# Patient Record
Sex: Female | Born: 1940 | Race: Black or African American | Hispanic: No | Marital: Single | Smoking: Never smoker
Health system: Southern US, Community
[De-identification: ages and names within clinical notes are randomized; demographics above are authoritative.]

## PROBLEM LIST (undated history)

## (undated) DIAGNOSIS — E039 Hypothyroidism, unspecified: Secondary | ICD-10-CM

## (undated) DIAGNOSIS — E871 Hypo-osmolality and hyponatremia: Secondary | ICD-10-CM

## (undated) DIAGNOSIS — R55 Syncope and collapse: Secondary | ICD-10-CM

## (undated) DIAGNOSIS — K219 Gastro-esophageal reflux disease without esophagitis: Secondary | ICD-10-CM

## (undated) DIAGNOSIS — E559 Vitamin D deficiency, unspecified: Secondary | ICD-10-CM

## (undated) DIAGNOSIS — I482 Chronic atrial fibrillation, unspecified: Secondary | ICD-10-CM

## (undated) DIAGNOSIS — I10 Essential (primary) hypertension: Secondary | ICD-10-CM

## (undated) DIAGNOSIS — S2242XA Multiple fractures of ribs, left side, initial encounter for closed fracture: Secondary | ICD-10-CM

## (undated) DIAGNOSIS — D638 Anemia in other chronic diseases classified elsewhere: Secondary | ICD-10-CM

## (undated) DIAGNOSIS — D696 Thrombocytopenia, unspecified: Secondary | ICD-10-CM

## (undated) HISTORY — PX: CHOLECYSTECTOMY: SHX55

## (undated) HISTORY — DX: Multiple fractures of ribs, left side, initial encounter for closed fracture: S22.42XA

## (undated) HISTORY — DX: Vitamin D deficiency, unspecified: E55.9

## (undated) HISTORY — DX: Anemia in other chronic diseases classified elsewhere: D63.8

## (undated) HISTORY — DX: Chronic atrial fibrillation, unspecified: I48.20

## (undated) HISTORY — DX: Hypomagnesemia: E83.42

## (undated) HISTORY — DX: Hypo-osmolality and hyponatremia: E87.1

## (undated) HISTORY — DX: Syncope and collapse: R55

## (undated) HISTORY — DX: Hypothyroidism, unspecified: E03.9

## (undated) HISTORY — DX: Thrombocytopenia, unspecified: D69.6

---

## 2009-05-26 ENCOUNTER — Inpatient Hospital Stay (HOSPITAL_COMMUNITY): Admission: EM | Admit: 2009-05-26 | Discharge: 2009-05-28 | Payer: Self-pay | Admitting: Emergency Medicine

## 2009-05-26 ENCOUNTER — Ambulatory Visit: Payer: Self-pay | Admitting: Internal Medicine

## 2010-03-25 LAB — SODIUM, URINE, RANDOM: Sodium, Ur: 59 mEq/L

## 2010-03-25 LAB — BASIC METABOLIC PANEL
BUN: 2 mg/dL — ABNORMAL LOW (ref 6–23)
BUN: 3 mg/dL — ABNORMAL LOW (ref 6–23)
CO2: 21 mEq/L (ref 19–32)
CO2: 22 mEq/L (ref 19–32)
Calcium: 7.6 mg/dL — ABNORMAL LOW (ref 8.4–10.5)
Calcium: 8.8 mg/dL (ref 8.4–10.5)
Chloride: 103 mEq/L (ref 96–112)
Creatinine, Ser: 0.71 mg/dL (ref 0.4–1.2)
Creatinine, Ser: 0.77 mg/dL (ref 0.4–1.2)
GFR calc Af Amer: >60 mL/min (ref >60)
GFR calc non Af Amer: >60 mL/min (ref >60)
GFR calc non Af Amer: >60 mL/min (ref >60)
Glucose, Bld: 112 mg/dL — ABNORMAL HIGH (ref 70–99)
Glucose, Bld: 98 mg/dL (ref 70–99)
Potassium: 4.4 mEq/L (ref 3.5–5.1)
Sodium: 131 mEq/L — ABNORMAL LOW (ref 135–145)

## 2010-03-25 LAB — DIFFERENTIAL
Basophils Absolute: 0 10*3/uL (ref 0.0–0.1)
Eosinophils Relative: 1 % (ref 0–5)
Monocytes Absolute: 0.5 10*3/uL (ref 0.1–1.0)
Neutrophils Relative %: 66 % (ref 43–77)

## 2010-03-25 LAB — PHOSPHORUS
Phosphorus: 2.2 mg/dL — ABNORMAL LOW (ref 2.3–4.6)
Phosphorus: 2.9 mg/dL (ref 2.3–4.6)

## 2010-03-25 LAB — MAGNESIUM
Magnesium: 1 mg/dL — ABNORMAL LOW (ref 1.5–2.5)
Magnesium: 1.6 mg/dL (ref 1.5–2.5)
Magnesium: 1.6 mg/dL (ref 1.5–2.5)

## 2010-03-25 LAB — COMPREHENSIVE METABOLIC PANEL
AST: 75 U/L — ABNORMAL HIGH (ref 0–37)
Albumin: 3.7 g/dL (ref 3.5–5.2)
Alkaline Phosphatase: 117 U/L (ref 39–117)
Calcium: 8.1 mg/dL — ABNORMAL LOW (ref 8.4–10.5)
Creatinine, Ser: 0.9 mg/dL (ref 0.4–1.2)
GFR calc non Af Amer: >60 mL/min (ref >60)
Sodium: 124 mEq/L — ABNORMAL LOW (ref 135–145)
Total Protein: 7 g/dL (ref 6.0–8.3)

## 2010-03-25 LAB — CARDIAC PANEL(CRET KIN+CKTOT+MB+TROPI)
CK, MB: 2.4 ng/mL (ref 0.3–4.0)
Relative Index: 2.2 (ref 0.0–2.5)
Relative Index: 2.3 (ref 0.0–2.5)
Total CK: 101 U/L (ref 7–177)
Total CK: 108 U/L (ref 7–177)
Troponin I: 0.03 ng/mL (ref 0.00–0.06)
Troponin I: 0.03 ng/mL (ref 0.00–0.06)

## 2010-03-25 LAB — CBC
HCT: 27.2 % — ABNORMAL LOW (ref 36.0–46.0)
HCT: 29 % — ABNORMAL LOW (ref 36.0–46.0)
Hemoglobin: 9.4 g/dL — ABNORMAL LOW (ref 12.0–15.0)
MCHC: 34.7 g/dL (ref 30.0–36.0)
MCHC: 34.7 g/dL (ref 30.0–36.0)
MCV: 89 fL (ref 78.0–100.0)
MCV: 90.4 fL (ref 78.0–100.0)
Platelets: 134 10*3/uL — ABNORMAL LOW (ref 150–400)
Platelets: 135 10*3/uL — ABNORMAL LOW (ref 150–400)
RBC: 3.01 MIL/uL — ABNORMAL LOW (ref 3.87–5.11)
RDW: 12.8 % (ref 11.5–15.5)
RDW: 12.8 % (ref 11.5–15.5)
WBC: 6.1 10*3/uL (ref 4.0–10.5)

## 2010-03-25 LAB — CK TOTAL AND CKMB (NOT AT ARMC)
CK, MB: 1.9 ng/mL (ref 0.3–4.0)
Relative Index: INVALID (ref 0.0–2.5)
Total CK: 99 U/L (ref 7–177)

## 2010-03-25 LAB — LIPID PANEL
Total CHOL/HDL Ratio: 1.4 RATIO
Triglycerides: 33 mg/dL (ref <150)
VLDL: 7 mg/dL (ref 0–40)

## 2010-03-25 LAB — RAPID URINE DRUG SCREEN, HOSP PERFORMED
Amphetamines: NOT DETECTED
Barbiturates: NOT DETECTED
Cocaine: NOT DETECTED

## 2010-03-25 LAB — IRON AND TIBC
Iron: 223 ug/dL — ABNORMAL HIGH (ref 42–135)
Saturation Ratios: 76 % — ABNORMAL HIGH (ref 20–55)
TIBC: 294 ug/dL (ref 250–470)
UIBC: 71 ug/dL

## 2010-03-25 LAB — TROPONIN I: Troponin I: 0.01 ng/mL (ref 0.00–0.06)

## 2010-03-25 LAB — TSH: TSH: 1.456 u[IU]/mL (ref 0.350–4.500)

## 2010-03-25 LAB — RETICULOCYTES
RBC.: 3.36 MIL/uL — ABNORMAL LOW (ref 3.87–5.11)
Retic Count, Absolute: 33.6 10*3/uL (ref 19.0–186.0)

## 2010-03-25 LAB — OSMOLALITY: Osmolality: 256 mOsm/kg — ABNORMAL LOW (ref 275–300)

## 2010-03-25 LAB — VITAMIN B12: Vitamin B-12: 724 pg/mL (ref 211–911)

## 2010-03-25 LAB — FOLATE: Folate: >20 ng/mL

## 2010-03-25 LAB — T4, FREE: Free T4: 0.98 ng/dL (ref 0.80–1.80)

## 2010-03-25 LAB — OSMOLALITY, URINE: Osmolality, Ur: 301 mOsm/kg — ABNORMAL LOW (ref 390–1090)

## 2010-03-25 LAB — FERRITIN: Ferritin: 363 ng/mL — ABNORMAL HIGH (ref 10–291)

## 2010-05-24 LAB — FECAL OCCULT BLOOD, GUAIAC: FECAL OCCULT BLD: NEGATIVE

## 2010-12-11 ENCOUNTER — Emergency Department (HOSPITAL_BASED_OUTPATIENT_CLINIC_OR_DEPARTMENT_OTHER)
Admission: EM | Admit: 2010-12-11 | Discharge: 2010-12-11 | Disposition: A | Discharge status: Home / Self Care | Payer: Medicare HMO | Attending: Emergency Medicine | Admitting: Emergency Medicine

## 2010-12-11 ENCOUNTER — Encounter: Payer: Self-pay | Admitting: Family Medicine

## 2010-12-11 ENCOUNTER — Emergency Department (INDEPENDENT_AMBULATORY_CARE_PROVIDER_SITE_OTHER): Payer: Medicare HMO

## 2010-12-11 DIAGNOSIS — K219 Gastro-esophageal reflux disease without esophagitis: Secondary | ICD-10-CM | POA: Insufficient documentation

## 2010-12-11 DIAGNOSIS — S2239XA Fracture of one rib, unspecified side, initial encounter for closed fracture: Secondary | ICD-10-CM

## 2010-12-11 DIAGNOSIS — R071 Chest pain on breathing: Secondary | ICD-10-CM | POA: Insufficient documentation

## 2010-12-11 DIAGNOSIS — W1809XA Striking against other object with subsequent fall, initial encounter: Secondary | ICD-10-CM

## 2010-12-11 DIAGNOSIS — S2249XA Multiple fractures of ribs, unspecified side, initial encounter for closed fracture: Secondary | ICD-10-CM

## 2010-12-11 DIAGNOSIS — W19XXXA Unspecified fall, initial encounter: Secondary | ICD-10-CM | POA: Insufficient documentation

## 2010-12-11 DIAGNOSIS — R079 Chest pain, unspecified: Secondary | ICD-10-CM

## 2010-12-11 DIAGNOSIS — Y92009 Unspecified place in unspecified non-institutional (private) residence as the place of occurrence of the external cause: Secondary | ICD-10-CM | POA: Insufficient documentation

## 2010-12-11 DIAGNOSIS — I10 Essential (primary) hypertension: Secondary | ICD-10-CM | POA: Insufficient documentation

## 2010-12-11 HISTORY — DX: Gastro-esophageal reflux disease without esophagitis: K21.9

## 2010-12-11 HISTORY — DX: Essential (primary) hypertension: I10

## 2010-12-11 LAB — BASIC METABOLIC PANEL
BUN: 14 mg/dL (ref 6–23)
Calcium: 9.4 mg/dL (ref 8.4–10.5)
Chloride: 98 mEq/L (ref 96–112)
Creatinine, Ser: 0.8 mg/dL (ref 0.50–1.10)
GFR calc Af Amer: 86 mL/min — ABNORMAL LOW (ref >90)
GFR calc non Af Amer: 74 mL/min — ABNORMAL LOW (ref >90)

## 2010-12-11 MED ORDER — OXYCODONE-ACETAMINOPHEN 5-325 MG PO TABS: 1.0000 | ORAL_TABLET | ORAL | Status: AC | PRN

## 2010-12-11 NOTE — ED Notes (Signed)
Dr. Tucich at bedside. 

## 2010-12-11 NOTE — ED Provider Notes (Addendum)
History     CSN: [id number] Arrival date & time: 12/11/2010  5:03 PM   First MD Initiated Contact with Patient 12/11/10 1747      Chief Complaint  Patient presents with  . Fall   patient fell yesterday. Currently to different times. Complains of injury to ribs. Pain is worse with deep breathing, moving or palpation.  Patient fell yesterday while she was holding the baby in. She states she "lost her balance." She denied any chest pain or palpitations. She had no injury to her head neck, pelvis, hips or extremities. She has had no recent illness or injury.  (Consider location/radiation/quality/duration/timing/severity/associated sxs/prior treatment) HPI  Past Medical History  Diagnosis Date  . Hypertension   . GERD (gastroesophageal reflux disease)     Past Surgical History  Procedure Date  . Cholecystectomy     No family history on file.  History  Substance Use Topics  . Smoking status: Never Smoker   . Smokeless tobacco: Not on file  . Alcohol Use: No    OB History    Grav Para Term Preterm Abortions TAB SAB Ect Mult Living                  Review of Systems  Allergies  Review of patient's allergies indicates not on file.  Home Medications  No current outpatient prescriptions on file.  BP 137/91  Pulse 84  Temp(Src) 98.2 F (36.8 C) (Oral)  Resp 16  Ht 5' 5.5" (1.664 m)  Wt 141 lb (63.957 kg)  BMI 23.11 kg/m2  SpO2 100%  Physical Exam  ED Course  Procedures (including critical care time)  Labs Reviewed - No data to display No results found.   No diagnosis found.    MDM  Pt is seen and examined;  Initial history and physical completed.  Will follow.     No results found.       Dg Ribs Unilateral W/chest Right  12/11/2010  *RADIOLOGY REPORT*  Clinical Data: Right chest pain status post fall.  RIGHT RIBS AND CHEST - 3+ VIEW  Comparison: None.  Findings: The heart is mildly enlarged.  The mediastinal contours are otherwise normal.  The  lungs are clear.  There is no pleural effusion or pneumothorax.  At least two nondisplaced right-sided rib fractures are noted, best seen on the oblique view and involving the fifth and sixth ribs. No displaced fracture is identified.  IMPRESSION:  1.  Acute nondisplaced fractures of the right fifth and sixth ribs. 2.  No pleural effusion, pneumothorax or other acute cardiopulmonary process.  Original Report Authenticated By: Gerrianne Scale, M.D.    6:56 PM Patient was given incentive spirometer with instructions to use. Electrolyte panel reviewed with the patient. The family and patient were given. Specific instructions for close outpatient followup with the primary care physician in 3 days to be rechecked. Again, the patient reported that she did not have any dizzy spells was just off balance when she fell. She will be stable for discharge at this time. She is given a short course of Percocet for pain control and was encouraged to take deep breaths throughout the day to avoid splinting and avoid pneumonia. They're instructed to return to the ED for any concerns such as difficulty breathing, fever, or chest pain, etc.    Theron Arista A. Patrica Duel, MD 12/11/10 1911  Lorelle Gibbs. Patrica Duel, MD 12/11/10 1911

## 2010-12-11 NOTE — ED Notes (Signed)
Pt sts she fell yesterday 2 x and "injured right rib pain that hurts to breath deep and moving". Pt sts she lives alone and ambulates without assistance.

## 2010-12-11 NOTE — ED Notes (Signed)
Pt attempted to provide urine sample but could not go.

## 2010-12-11 NOTE — ED Notes (Signed)
Pt acuity changed to level 3 due to pain level.

## 2012-06-12 ENCOUNTER — Emergency Department (HOSPITAL_BASED_OUTPATIENT_CLINIC_OR_DEPARTMENT_OTHER): Payer: Medicare Other

## 2012-06-12 ENCOUNTER — Emergency Department (HOSPITAL_BASED_OUTPATIENT_CLINIC_OR_DEPARTMENT_OTHER)
Admission: EM | Admit: 2012-06-12 | Discharge: 2012-06-12 | Disposition: A | Discharge status: Other Acute Inpatient Hospital | Payer: Medicare Other | Attending: Emergency Medicine | Admitting: Emergency Medicine

## 2012-06-12 ENCOUNTER — Encounter (HOSPITAL_BASED_OUTPATIENT_CLINIC_OR_DEPARTMENT_OTHER): Payer: Self-pay | Admitting: Emergency Medicine

## 2012-06-12 DIAGNOSIS — R0989 Other specified symptoms and signs involving the circulatory and respiratory systems: Secondary | ICD-10-CM | POA: Insufficient documentation

## 2012-06-12 DIAGNOSIS — R110 Nausea: Secondary | ICD-10-CM | POA: Insufficient documentation

## 2012-06-12 DIAGNOSIS — R059 Cough, unspecified: Secondary | ICD-10-CM | POA: Insufficient documentation

## 2012-06-12 DIAGNOSIS — K219 Gastro-esophageal reflux disease without esophagitis: Secondary | ICD-10-CM | POA: Insufficient documentation

## 2012-06-12 DIAGNOSIS — Z79899 Other long term (current) drug therapy: Secondary | ICD-10-CM | POA: Insufficient documentation

## 2012-06-12 DIAGNOSIS — I10 Essential (primary) hypertension: Secondary | ICD-10-CM | POA: Insufficient documentation

## 2012-06-12 DIAGNOSIS — I509 Heart failure, unspecified: Secondary | ICD-10-CM | POA: Insufficient documentation

## 2012-06-12 DIAGNOSIS — R05 Cough: Secondary | ICD-10-CM | POA: Insufficient documentation

## 2012-06-12 DIAGNOSIS — R0609 Other forms of dyspnea: Secondary | ICD-10-CM | POA: Insufficient documentation

## 2012-06-12 LAB — CBC WITH DIFFERENTIAL/PLATELET
Basophils Absolute: 0 10*3/uL (ref 0.0–0.1)
Basophils Relative: 0 % (ref 0–1)
HCT: 26.8 % — ABNORMAL LOW (ref 36.0–46.0)
MCHC: 36.9 g/dL — ABNORMAL HIGH (ref 30.0–36.0)
Monocytes Absolute: 1.3 10*3/uL — ABNORMAL HIGH (ref 0.1–1.0)
Neutro Abs: 4.7 10*3/uL (ref 1.7–7.7)
Platelets: 302 10*3/uL (ref 150–400)
RDW: 13.5 % (ref 11.5–15.5)

## 2012-06-12 LAB — BASIC METABOLIC PANEL
Calcium: 9.1 mg/dL (ref 8.4–10.5)
Chloride: 97 mEq/L (ref 96–112)
Creatinine, Ser: 0.6 mg/dL (ref 0.50–1.10)
GFR calc Af Amer: >90 mL/min (ref >90)

## 2012-06-12 LAB — PRO B NATRIURETIC PEPTIDE: Pro B Natriuretic peptide (BNP): 2232 pg/mL — ABNORMAL HIGH (ref 0–125)

## 2012-06-12 NOTE — ED Notes (Signed)
Sob and cough x 1 week.  Occasional yellow sputum.  Pt sob on exertion.

## 2012-06-12 NOTE — ED Provider Notes (Signed)
History     This chart was scribed for Monica Munch, MD by Jiles Prows, ED Scribe. The patient was seen in room MH11/MH11 and the patient's care was started at 5:54 PM.    CSN: [id number]  Arrival date & time 06/12/12  1725   Chief Complaint  Patient presents with  . Cough  . Shortness of Breath    The history is provided by the patient and medical records. No language interpreter was used.   HPI Comments: Monica Ingram is a 72 y.o. female with a h/o GERD and HTN who presents to the Emergency Department complaining of moderate cough, shortness of breath, and nausea that began about a week ago. The cough is occasionally productive of yellow sputum.  She reports the SOB is exacerbated upon exertion.  Pt has tried hot tea and honey but found no relief.  Pt denies LOC, headache, diaphoresis, fever, chills, vomiting, diarrhea, weakness, and any other pain.   Past Medical History  Diagnosis Date  . Hypertension   . GERD (gastroesophageal reflux disease)     Past Surgical History  Procedure Laterality Date  . Cholecystectomy      No family history on file.  History  Substance Use Topics  . Smoking status: Never Smoker   . Smokeless tobacco: Not on file  . Alcohol Use: No    OB History   Grav Para Term Preterm Abortions TAB SAB Ect Mult Living                  Review of Systems  Constitutional:       Per HPI, otherwise negative  HENT:       Per HPI, otherwise negative  Respiratory: Positive for cough and shortness of breath.        Per HPI, otherwise negative  Cardiovascular:       Per HPI, otherwise negative  Gastrointestinal: Positive for nausea. Negative for vomiting.  Endocrine:       Negative aside from HPI  Genitourinary:       Neg aside from HPI   Musculoskeletal:       Per HPI, otherwise negative  Skin: Negative.   Neurological: Negative for syncope.  A complete 10 system review of systems was obtained and all systems are negative except as noted in the  HPI and PMH.    Allergies  Other  Home Medications   Current Outpatient Rx  Name  Route  Sig  Dispense  Refill  . amLODipine (NORVASC) 5 MG tablet   Oral   Take 5 mg by mouth daily.           . calcium carbonate (CALCIUM 500) 1250 MG tablet   Oral   Take 1 tablet by mouth daily.           . Cholecalciferol (VITAMIN D) 2000 UNITS tablet   Oral   Take 4,000-6,000 Units by mouth 2 (two) times daily. Take 3 tabs with the biggest meal and take 2 tabs in the afternoon          . esomeprazole (NEXIUM) 40 MG capsule   Oral   Take 40 mg by mouth daily before breakfast.           . fish oil-omega-3 fatty acids 1000 MG capsule   Oral   Take 1 g by mouth daily.           . metoprolol tartrate (LOPRESSOR) 25 MG tablet   Oral   Take 25 mg by  mouth 2 (two) times daily.           . potassium chloride SA (K-DUR,KLOR-CON) 20 MEQ tablet   Oral   Take 20 mEq by mouth daily.             Triage Vitals: BP 154/96  Pulse 103  Temp(Src) 98.5 F (36.9 C) (Oral)  Resp 20  Ht 5\' 7"  (1.702 m)  Wt 136 lb (61.689 kg)  BMI 21.3 kg/m2  SpO2 100%  Physical Exam  Nursing note and vitals reviewed. Constitutional: She is oriented to person, place, and time. She appears well-developed and well-nourished. No distress.  HENT:  Head: Normocephalic and atraumatic.  Eyes: Conjunctivae and EOM are normal.  Cardiovascular: Normal rate, regular rhythm and normal heart sounds.   Pulmonary/Chest: Effort normal and breath sounds normal. No stridor. No respiratory distress.  Abdominal: She exhibits no distension.  Musculoskeletal: She exhibits no edema.  Neurological: She is alert and oriented to person, place, and time. No cranial nerve deficit.  Skin: Skin is warm and dry.  Psychiatric: She has a normal mood and affect.    ED Course  Procedures (including critical care time) 5:56 PM - Discussed ED treatment with pt at bedside including labs and chest x-ray and pt agrees.   Labs  Reviewed - No data to display No results found.   No diagnosis found.   Date: 06/12/2012  Rate: 90  Rhythm: atrial fibrillation  QRS Axis: left  Intervals: afib  ST/T Wave abnormalities: nonspecific T wave changes  Conduction Disutrbances:none  Narrative Interpretation:   Old EKG Reviewed: none available AFIB  Card: 85afib, O2-93%ra, borderline.  Update: On repeat exam the patient appears calm, but continues to be in atrial fibrillation.  She now states that she is currently being evaluated for some hepatic lesion.  During that evaluation she has been advised to see a cardiologist for evaluation, though she lacks any cardiology diagnosis thus far.    MDM   I personally performed the services described in this documentation, which was scribed in my presence. The recorded information has been reviewed and is accurate. On exam the patient is borderline hypoxic, though in no distress.  However, she has evidence of new atrial fibrillation, new elevated BNP concerning for heart failure.  She has no prior echocardiogram, and given her history of hypertension, her lack of prior evaluation, she was admitted for further evaluation and management. This patient presents with ongoing dyspnea.    Monica Munch, MD 06/12/12 2121

## 2014-04-20 ENCOUNTER — Emergency Department (HOSPITAL_BASED_OUTPATIENT_CLINIC_OR_DEPARTMENT_OTHER): Payer: Medicare HMO

## 2014-04-20 ENCOUNTER — Emergency Department (HOSPITAL_BASED_OUTPATIENT_CLINIC_OR_DEPARTMENT_OTHER)
Admission: EM | Admit: 2014-04-20 | Discharge: 2014-04-20 | Disposition: A | Discharge status: Home / Self Care | Payer: Medicare HMO | Attending: Emergency Medicine | Admitting: Emergency Medicine

## 2014-04-20 ENCOUNTER — Encounter (HOSPITAL_BASED_OUTPATIENT_CLINIC_OR_DEPARTMENT_OTHER): Payer: Self-pay

## 2014-04-20 DIAGNOSIS — K219 Gastro-esophageal reflux disease without esophagitis: Secondary | ICD-10-CM | POA: Diagnosis not present

## 2014-04-20 DIAGNOSIS — R059 Cough, unspecified: Secondary | ICD-10-CM

## 2014-04-20 DIAGNOSIS — I10 Essential (primary) hypertension: Secondary | ICD-10-CM | POA: Diagnosis not present

## 2014-04-20 DIAGNOSIS — R05 Cough: Secondary | ICD-10-CM | POA: Diagnosis present

## 2014-04-20 DIAGNOSIS — Z79899 Other long term (current) drug therapy: Secondary | ICD-10-CM | POA: Insufficient documentation

## 2014-04-20 DIAGNOSIS — J302 Other seasonal allergic rhinitis: Secondary | ICD-10-CM | POA: Diagnosis not present

## 2014-04-20 MED ORDER — BENZONATATE 100 MG PO CAPS: 100.0000 mg | ORAL_CAPSULE | Freq: Three times a day (TID) | ORAL | Status: DC | PRN

## 2014-04-20 NOTE — ED Notes (Signed)
Reports cough x 2 weeks, sometimes productive

## 2014-04-20 NOTE — Discharge Instructions (Signed)
Allergies °Allergies may happen from anything your body is sensitive to. This may be food, medicines, pollens, chemicals, and nearly anything around you in everyday life that produces allergens. An allergen is anything that causes an allergy producing substance. Heredity is often a factor in causing these problems. This means you may have some of the same allergies as your parents. °Food allergies happen in all age groups. Food allergies are some of the most severe and life threatening. Some common food allergies are cow's milk, seafood, eggs, nuts, wheat, and soybeans. °SYMPTOMS  °· Swelling around the mouth. °· An itchy red rash or hives. °· Vomiting or diarrhea. °· Difficulty breathing. °SEVERE ALLERGIC REACTIONS ARE LIFE-THREATENING. °This reaction is called anaphylaxis. It can cause the mouth and throat to swell and cause difficulty with breathing and swallowing. In severe reactions only a trace amount of food (for example, peanut oil in a salad) may cause death within seconds. °Seasonal allergies occur in all age groups. These are seasonal because they usually occur during the same season every year. They may be a reaction to molds, grass pollens, or tree pollens. Other causes of problems are house dust mite allergens, pet dander, and mold spores. The symptoms often consist of nasal congestion, a runny itchy nose associated with sneezing, and tearing itchy eyes. There is often an associated itching of the mouth and ears. The problems happen when you come in contact with pollens and other allergens. Allergens are the particles in the air that the body reacts to with an allergic reaction. This causes you to release allergic antibodies. Through a chain of events, these eventually cause you to release histamine into the blood stream. Although it is meant to be protective to the body, it is this release that causes your discomfort. This is why you were given anti-histamines to feel better.  If you are unable to  pinpoint the offending allergen, it may be determined by skin or blood testing. Allergies cannot be cured but can be controlled with medicine. °Hay fever is a collection of all or some of the seasonal allergy problems. It may often be treated with simple over-the-counter medicine such as diphenhydramine. Take medicine as directed. Do not drink alcohol or drive while taking this medicine. Check with your caregiver or package insert for child dosages. °If these medicines are not effective, there are many new medicines your caregiver can prescribe. Stronger medicine such as nasal spray, eye drops, and corticosteroids may be used if the first things you try do not work well. Other treatments such as immunotherapy or desensitizing injections can be used if all else fails. Follow up with your caregiver if problems continue. These seasonal allergies are usually not life threatening. They are generally more of a nuisance that can often be handled using medicine. °HOME CARE INSTRUCTIONS  °· If unsure what causes a reaction, keep a diary of foods eaten and symptoms that follow. Avoid foods that cause reactions. °· If hives or rash are present: °· Take medicine as directed. °· You may use an over-the-counter antihistamine (diphenhydramine) for hives and itching as needed. °· Apply cold compresses (cloths) to the skin or take baths in cool water. Avoid hot baths or showers. Heat will make a rash and itching worse. °· If you are severely allergic: °· Following a treatment for a severe reaction, hospitalization is often required for closer follow-up. °· Wear a medic-alert bracelet or necklace stating the allergy. °· You and your family must learn how to give adrenaline or use   an anaphylaxis kit.  If you have had a severe reaction, always carry your anaphylaxis kit or EpiPen with you. Use this medicine as directed by your caregiver if a severe reaction is occurring. Failure to do so could have a fatal outcome. SEEK MEDICAL  CARE IF:  You suspect a food allergy. Symptoms generally happen within 30 minutes of eating a food.  Your symptoms have not gone away within 2 days or are getting worse.  You develop new symptoms.  You want to retest yourself or your child with a food or drink you think causes an allergic reaction. Never do this if an anaphylactic reaction to that food or drink has happened before. Only do this under the care of a caregiver. SEEK IMMEDIATE MEDICAL CARE IF:   You have difficulty breathing, are wheezing, or have a tight feeling in your chest or throat.  You have a swollen mouth, or you have hives, swelling, or itching all over your body.  You have had a severe reaction that has responded to your anaphylaxis kit or an EpiPen. These reactions may return when the medicine has worn off. These reactions should be considered life threatening. MAKE SURE YOU:   Understand these instructions.  Will watch your condition.  Will get help right away if you are not doing well or get worse. Document Released: 03/18/2002 Document Revised: 04/19/2012 Document Reviewed: 08/23/2007 [facility] Patient Information 2015 [place], [place]. This information is not intended to replace advice given to you by your health care provider. Make sure you discuss any questions you have with your health care provider.     Cough, Adult  A cough is a reflex that helps clear your throat and airways. It can help heal the body or may be a reaction to an irritated airway. A cough may only last 2 or 3 weeks (acute) or may last more than 8 weeks (chronic).  CAUSES Acute cough:  Viral or bacterial infections. Chronic cough:  Infections.  Allergies.  Asthma.  Post-nasal drip.  Smoking.  Heartburn or acid reflux.  Some medicines.  Chronic lung problems (COPD).  Cancer. SYMPTOMS   Cough.  Fever.  Chest pain.  Increased breathing rate.  High-pitched whistling sound when breathing (wheezing).  Colored  mucus that you cough up (sputum). TREATMENT   A bacterial cough may be treated with antibiotic medicine.  A viral cough must run its course and will not respond to antibiotics.  Your caregiver may recommend other treatments if you have a chronic cough. HOME CARE INSTRUCTIONS   Only take over-the-counter or prescription medicines for pain, discomfort, or fever as directed by your caregiver. Use cough suppressants only as directed by your caregiver.  Use a cold steam vaporizer or humidifier in your bedroom or home to help loosen secretions.  Sleep in a semi-upright position if your cough is worse at night.  Rest as needed.  Stop smoking if you smoke. SEEK IMMEDIATE MEDICAL CARE IF:   You have pus in your sputum.  Your cough starts to worsen.  You cannot control your cough with suppressants and are losing sleep.  You begin coughing up blood.  You have difficulty breathing.  You develop pain which is getting worse or is uncontrolled with medicine.  You have a fever. MAKE SURE YOU:   Understand these instructions.  Will watch your condition.  Will get help right away if you are not doing well or get worse. Document Released: 06/21/2010 Document Revised: 03/17/2011 Document Reviewed: 06/21/2010 [employer] Patient Information  2015 [employer]. This information is not intended to replace advice given to you by your health care provider. Make sure you discuss any questions you have with your health care provider.

## 2014-04-20 NOTE — ED Provider Notes (Signed)
CSN: [id number]     Arrival date & time 04/20/14  1513 History   First MD Initiated Contact with Patient 04/20/14 1546     Chief Complaint  Patient presents with  . Cough     (Consider location/radiation/quality/duration/timing/severity/associated sxs/prior Treatment) HPI  74 year old female presents with intermittent cough for the past 2 weeks. Feels similar to prior allergies but last year she presented with similar symptoms and was diagnosed with pneumonia. The patient has been having tearing in both eyes as well as nasal congestion. Symptoms are most consistent whenever she goes outside. Has been taking Benadryl and Claritin intermittently. Mucinex does not seem to help. Patient denies any shortness of breath. Occasionally the cough has white sputum. No smoking history. No prior history of asthma or COPD.  Past Medical History  Diagnosis Date  . Hypertension   . GERD (gastroesophageal reflux disease)    Past Surgical History  Procedure Laterality Date  . Cholecystectomy     No family history on file. History  Substance Use Topics  . Smoking status: Never Smoker   . Smokeless tobacco: Not on file  . Alcohol Use: No   OB History    No data available     Review of Systems  Constitutional: Negative for fever and chills.  HENT: Positive for congestion. Negative for sore throat.   Respiratory: Positive for cough. Negative for shortness of breath.   Gastrointestinal: Negative for vomiting.  All other systems reviewed and are negative.     Allergies  Other  Home Medications   Prior to Admission medications   Medication Sig Start Date End Date Taking? Authorizing Provider  amLODipine (NORVASC) 5 MG tablet Take 5 mg by mouth daily.      Historical Provider, MD  benzonatate (TESSALON) 100 MG capsule Take 1 capsule (100 mg total) by mouth 3 (three) times daily as needed for cough. 04/20/14   Pricilla Loveless, MD  calcium carbonate (CALCIUM 500) 1250 MG tablet Take 1 tablet by  mouth daily.      Historical Provider, MD  Cholecalciferol (VITAMIN D) 2000 UNITS tablet Take 4,000-6,000 Units by mouth 2 (two) times daily. Take 3 tabs with the biggest meal and take 2 tabs in the afternoon     Historical Provider, MD  esomeprazole (NEXIUM) 40 MG capsule Take 40 mg by mouth daily before breakfast.      Historical Provider, MD  fish oil-omega-3 fatty acids 1000 MG capsule Take 1 g by mouth daily.      Historical Provider, MD  metoprolol tartrate (LOPRESSOR) 25 MG tablet Take 25 mg by mouth 2 (two) times daily.      Historical Provider, MD  potassium chloride SA (K-DUR,KLOR-CON) 20 MEQ tablet Take 20 mEq by mouth daily.      Historical Provider, MD   BP 168/108 mmHg  Pulse 70  Temp(Src) 98.2 F (36.8 C) (Oral)  Resp 16  Ht  (1.651 m)  Wt 129 lb (58.514 kg)  BMI 21.47 kg/m2  SpO2 100% Physical Exam  Constitutional: She is oriented to person, place, and time. She appears well-developed and well-nourished.  HENT:  Head: Normocephalic and atraumatic.  Right Ear: External ear normal.  Left Ear: External ear normal.  Nose: Nose normal.  Mouth/Throat: Oropharynx is clear and moist. No oropharyngeal exudate.  Eyes: Right eye exhibits no discharge. Left eye exhibits no discharge.  Mild tearing to bilateral eyes  Neck: Neck supple.  Cardiovascular: Normal rate, regular rhythm and normal heart sounds.  Pulmonary/Chest: Effort normal and breath sounds normal. She has no wheezes. She has no rales.  Abdominal: She exhibits no distension.  Neurological: She is alert and oriented to person, place, and time.  Skin: Skin is warm and dry.  Nursing note and vitals reviewed.   ED Course  Procedures (including critical care time) Labs Review Labs Reviewed - No data to display  Imaging Review Dg Chest 2 View  04/20/2014   CLINICAL DATA:  Cough and chest congestion for 2 weeks.  EXAM: CHEST  2 VIEW  COMPARISON:  PA and lateral chest 06/12/2012.  FINDINGS: There is  cardiomegaly without edema. The lungs are clear. No pneumothorax or pleural effusion.  IMPRESSION: Cardiomegaly without acute disease.   Electronically Signed   By: Drusilla Kannerhomas  Dalessio M.D.   On: 04/20/2014 15:41     EKG Interpretation None      MDM   Final diagnoses:  Seasonal allergies  Cough  Patient's symptoms appear to be due to seasonal allergies. I recommended she use the Claritin more consistently, will also provide Tessalon Perles for the cough. Discussed other symptomatic management. No evidence of pneumonia on chest x-ray. Lungs sound normal. No cardiac symptoms. Stable for discharge.    Pricilla LovelessScott Ziquan Fidel, MD 04/20/14 [phone]

## 2015-09-07 DIAGNOSIS — S2242XA Multiple fractures of ribs, left side, initial encounter for closed fracture: Secondary | ICD-10-CM

## 2015-09-07 HISTORY — DX: Multiple fractures of ribs, left side, initial encounter for closed fracture: S22.42XA

## 2015-09-14 LAB — HEPATIC FUNCTION PANEL
ALK PHOS: 97 U/L (ref 25–125)
ALT: 13 U/L (ref 7–35)
AST: 24 U/L (ref 13–35)
BILIRUBIN, TOTAL: 1.1 mg/dL

## 2015-09-16 LAB — BASIC METABOLIC PANEL
BUN: 16 mg/dL (ref 4–21)
CREATININE: 1.1 mg/dL (ref 0.5–1.1)
Glucose: 113 mg/dL
POTASSIUM: 3.3 mmol/L — AB (ref 3.4–5.3)
SODIUM: 137 mmol/L (ref 137–147)

## 2015-09-16 LAB — CBC AND DIFFERENTIAL
HCT: 27 % — AB (ref 36–46)
Hemoglobin: 9.8 g/dL — AB (ref 12.0–16.0)
Platelets: 122 10*3/uL — AB (ref 150–399)
WBC: 5.7 10*3/mL

## 2015-09-19 LAB — BASIC METABOLIC PANEL
BUN: 13 mg/dL (ref 4–21)
Creatinine: 1 mg/dL (ref 0.5–1.1)
Glucose: 116 mg/dL
Potassium: 3.5 mmol/L (ref 3.4–5.3)
SODIUM: 132 mmol/L — AB (ref 137–147)

## 2015-09-19 LAB — CBC AND DIFFERENTIAL
HEMATOCRIT: 28 % — AB (ref 36–46)
HEMOGLOBIN: 10.4 g/dL — AB (ref 12.0–16.0)
Platelets: 152 10*3/uL (ref 150–399)
WBC: 5.4 10^3/mL

## 2015-09-20 ENCOUNTER — Non-Acute Institutional Stay (SKILLED_NURSING_FACILITY): Payer: Medicare HMO | Admitting: Internal Medicine

## 2015-09-20 ENCOUNTER — Encounter: Payer: Self-pay | Admitting: Internal Medicine

## 2015-09-20 DIAGNOSIS — I10 Essential (primary) hypertension: Secondary | ICD-10-CM | POA: Diagnosis not present

## 2015-09-20 DIAGNOSIS — I482 Chronic atrial fibrillation, unspecified: Secondary | ICD-10-CM | POA: Insufficient documentation

## 2015-09-20 DIAGNOSIS — E871 Hypo-osmolality and hyponatremia: Secondary | ICD-10-CM | POA: Diagnosis not present

## 2015-09-20 DIAGNOSIS — I50810 Right heart failure, unspecified: Secondary | ICD-10-CM | POA: Insufficient documentation

## 2015-09-20 DIAGNOSIS — K219 Gastro-esophageal reflux disease without esophagitis: Secondary | ICD-10-CM

## 2015-09-20 DIAGNOSIS — I509 Heart failure, unspecified: Secondary | ICD-10-CM

## 2015-09-20 DIAGNOSIS — E038 Other specified hypothyroidism: Secondary | ICD-10-CM

## 2015-09-20 DIAGNOSIS — R4189 Other symptoms and signs involving cognitive functions and awareness: Secondary | ICD-10-CM | POA: Diagnosis not present

## 2015-09-20 DIAGNOSIS — S2232XS Fracture of one rib, left side, sequela: Secondary | ICD-10-CM | POA: Diagnosis not present

## 2015-09-20 DIAGNOSIS — E876 Hypokalemia: Secondary | ICD-10-CM | POA: Diagnosis not present

## 2015-09-20 DIAGNOSIS — R5381 Other malaise: Secondary | ICD-10-CM

## 2015-09-20 DIAGNOSIS — D638 Anemia in other chronic diseases classified elsewhere: Secondary | ICD-10-CM

## 2015-09-20 NOTE — Progress Notes (Signed)
LOCATION: [facility]  PCP: No primary care provider on file.   Code Status: Full Code  Goals of care: Advanced Directive information Advanced Directives 04/20/2014  Does patient have an advance directive? No  Would patient like information on creating an advanced directive? No - patient declined information       Extended Emergency Contact Information Primary Emergency Contact: Etta Grandchild Address: [address]          [place], [place] [address] [place] Home Phone: [phone] Relation: Daughter Secondary Emergency Contact: Elyse Jarvis Address: [address]          [place], [place] [address] [place] Home Phone: [phone] Work Phone: [phone] Mobile Phone: [phone] Relation: Daughter   Allergies  Allergen Reactions  . Ace Inhibitors   . Other     Pt cannot take any products that affect her liver    Chief Complaint  Patient presents with  . New Admit To SNF    New Admission     HPI:  Patient is a 75 y.o. female seen today for short term rehabilitation post hospital admission from 09/12/15-09/19/15 post syncope with fall and left multiple ribs 9th-11th fracture. CT head, MRI brain. Echocardiogram showed severe TR with right ventricular volume overload and normal LVF. Cardiology was consulted and recommended no additional workup. She received diuresis and had acute renal impairment. She has PMH of afib, HTN, chronic thrombocytopenia, hypothyroidism among others. She is seen in her room today.   Review of Systems:  Constitutional: Negative for fever, chills, diaphoresis. Feels weak and tired. HENT: Negative for headache, congestion, nasal discharge Eyes: Negative for blurred vision, double vision and discharge.  Respiratory: Negative for cough, shortness of breath and wheezing.   Cardiovascular: Negative for chest pain, palpitations, leg swelling.  Gastrointestinal: Negative for heartburn, nausea, vomiting,  abdominal pain. Last bowel movement was 2 days ago. Genitourinary: Negative for dysuria.  Musculoskeletal: Negative for fall in the facility. Positive for back pain Skin: Negative for itching, rash.  Neurological: Negative for dizziness. Psychiatric/Behavioral: Negative for depression   Past Medical History:  Diagnosis Date  . GERD (gastroesophageal reflux disease)   . Hypertension    Past Surgical History:  Procedure Laterality Date  . CHOLECYSTECTOMY     Social History:   reports that she has never smoked. She does not have any smokeless tobacco history on file. She reports that she does not drink alcohol or use drugs.  No family history on file.  Medications:   Medication List       Accurate as of 09/20/15  2:35 PM. Always use your most recent med list.          albuterol 108 (90 Base) MCG/ACT inhaler Commonly known as:  PROVENTIL HFA;VENTOLIN HFA Inhale 2 puffs into the lungs every 6 (six) hours as needed for wheezing or shortness of breath.   aspirin EC 81 MG tablet Take 81 mg by mouth daily.   benzonatate 100 MG capsule Commonly known as:  TESSALON Take 100 mg by mouth every 6 (six) hours as needed for cough.   cetirizine 10 MG tablet Commonly known as:  ZYRTEC Take 10 mg by mouth daily.   ergocalciferol 50000 units capsule Commonly known as:  VITAMIN D2 Take 50,000 Units by mouth once a week.   fluticasone 50 MCG/ACT nasal spray Commonly known as:  FLONASE Place 1 spray into both nostrils 2 (two) times daily.   iron polysaccharides 150 MG capsule  Commonly known as:  NIFEREX Take 150 mg by mouth daily.   levothyroxine 25 MCG tablet Commonly known as:  SYNTHROID, LEVOTHROID Take 25 mcg by mouth daily before breakfast.   magnesium oxide 400 MG tablet Commonly known as:  MAG-OX Take 400 mg by mouth 3 (three) times daily.   metoprolol tartrate 25 MG tablet Commonly known as:  LOPRESSOR Take 25 mg by mouth 2 (two) times daily.   omeprazole 40 MG  capsule Commonly known as:  PRILOSEC Take 40 mg by mouth daily.   oxycodone 5 MG capsule Commonly known as:  OXY-IR Take 5-10 mg by mouth every 4 (four) hours as needed for pain.   potassium chloride SA 20 MEQ tablet Commonly known as:  K-DUR,KLOR-CON Take 20 mEq by mouth daily.   torsemide 10 MG tablet Commonly known as:  DEMADEX Take 10 mg by mouth daily.       Immunizations:  There is no immunization history on file for this patient.   Physical Exam:  Vitals:   09/20/15 1422  BP: (!) 148/83  Pulse: 67  Resp: 16  Temp: 98.4 F (36.9 C)  TempSrc: Oral  SpO2: 91%  Weight: 145 lb (65.8 kg)  Height: 5\' 5"  (1.651 m)   Body mass index is 24.13 kg/m.  General- elderly female, well built, in no acute distress Head- normocephalic, atraumatic Nose- no nasal discharge Throat- moist mucus membrane, edentulous Eyes- no pallor, no icterus, no discharge, normal conjunctiva, normal sclera Neck- no cervical lymphadenopathy Cardiovascular- irregular heart rate, no murmur, trace ankle edema Respiratory- bilateral clear to auscultation, no wheeze, no rhonchi, no crackles, no use of accessory muscles Abdomen- bowel sounds present, soft, non tender, periumbilical hernia present Musculoskeletal- able to move all 4 extremities, generalized weakness, on WC Neurological- alert and oriented to self, place and month but not to year and other person.  Skin- warm and dry, left mid back area with bruise and small hematoma, tender to palpation, bruise to arms Psychiatry- normal mood and affect    Labs reviewed: Basic Metabolic Panel:  Recent Labs  16/10/9607/10/17 09/19/15  NA 137 132*  K 3.3* 3.5  BUN 16 13  CREATININE 1.1 1.0   Liver Function Tests:  Recent Labs  09/14/15  AST 24  ALT 13  ALKPHOS 97   No results for input(s): LIPASE, AMYLASE in the last 8760 hours. No results for input(s): AMMONIA in the last 8760 hours. CBC:  Recent Labs  09/16/15 09/19/15  WBC 5.7 5.4    HGB 9.8* 10.4*  HCT 27* 28*  PLT 122* 152    Radiological Exams: No results found.  Assessment/Plan  Physical deconditioning Will have her work with physical therapy and occupational therapy team to help with gait training and muscle strengthening exercises.fall precautions. Skin care. Encourage to be out of bed.   Left rib fracture Currently on oxyIR 5 mg 1-2 tab q4h prn pain. Start tylenol 650 mg tid x 1 week to help with pain. Add incentive spirometer and encourage to use to prevent atelectasis  Chronic afib Rate currently controlled. Continue baby aspirin and metoprolol 25 mg bid.   Right ventricular failure Continue her torsemide and metoprolol. Monitor her breathing. Monitor weight 3 days a week. Check bmp  Hyponatremia Monitor bmp  Hypokalemia Continue kcl supplement, check bmp  Hypomagnesemia Continue magnesium oxide and monitor level  Anemia of chronic disease Continue niferex, monitor cbc  HTN Monitor bp, continue lopressor 25 mg bid  gerd Stable on prilosec 40 mg  daily, monitor  Hypothyroidism Check tsh, continue levothyroxine 25 mcg daily  Cognitive impairment Get SLP to evaluate for this. Supportive care. Fall precautions. Pressure ulcer prophylaxis.    Goals of care: short term rehabilitation   Labs/tests ordered: cbc, cmp, tsh, mg 09/24/15  Family/ staff Communication: reviewed care plan with patient and nursing supervisor    Oneal Grout, MD Internal Medicine [facility] [address] [place], [place] [address] Cell Phone (Monday-Friday 8 am - 5 pm): [phone] On Call: [phone] and follow prompts after 5 pm and on weekends Office Phone: [phone] Office Fax: [phone]

## 2015-09-24 LAB — BASIC METABOLIC PANEL
BUN: 18 mg/dL (ref 4–21)
Creatinine: 1.1 mg/dL (ref 0.5–1.1)
GLUCOSE: 93 mg/dL
Potassium: 3.7 mmol/L (ref 3.4–5.3)
Sodium: 139 mmol/L (ref 137–147)

## 2015-09-24 LAB — HEPATIC FUNCTION PANEL
ALT: 11 U/L (ref 7–35)
AST: 19 U/L (ref 13–35)
Alkaline Phosphatase: 79 U/L (ref 25–125)
Bilirubin, Total: 0.7 mg/dL

## 2015-09-24 LAB — CBC AND DIFFERENTIAL
HEMATOCRIT: 26 % — AB (ref 36–46)
HEMOGLOBIN: 9.4 g/dL — AB (ref 12.0–16.0)
Platelets: 175 10*3/uL (ref 150–399)
WBC: 5.4 10*3/mL

## 2015-09-24 LAB — TSH: TSH: 1.51 u[IU]/mL (ref 0.41–5.90)

## 2015-09-25 ENCOUNTER — Non-Acute Institutional Stay (SKILLED_NURSING_FACILITY): Payer: Medicare HMO | Admitting: Adult Health

## 2015-09-25 ENCOUNTER — Encounter: Payer: Self-pay | Admitting: Adult Health

## 2015-09-25 NOTE — Progress Notes (Signed)
Patient ID: Monica Ingram, female   DOB: 05/27/1940, 75 y.o.   MRN: [id number]    DATE:  09/25/2015   MRN:  [id number]  BIRTHDAY: Nov 21, 1940  Facility:  Nursing Home Location:  [facility]  Nursing Home Room Number: 808-P  LEVEL OF CARE:  SNF [phone]  Contact Information    Name Relation Home Work Mobile   [place] Daughter [phone]     Elyse Jarvis Daughter [phone]       Code Status History    This patient does not have a recorded code status. Please follow your organizational policy for patients in this situation.       Chief Complaint  Patient presents with  . Acute Visit    Hypomagnesemia    HISTORY OF PRESENT ILLNESS:  This is a 75 year old female who was noted to have magnesium level 1.2. She has no complaints of weakness.  She has been admitted to [facility] on 09/19/15 from [facility] with post syncope with fall and left multiple ribs 9th-11th fracture. Echocardiogram showed severe TR with right ventricular volume overload and normal LVF. Cardiology was consulted and recommended no additional work-up. She was diuresed and had acute renal impairment.  She has been admitted to [facility] for a short-term rehabilitation.  PAST MEDICAL HISTORY:  Past Medical History:  Diagnosis Date  . Acquired hypothyroidism   . Anemia of chronic disease   . Chronic atrial fibrillation (HCC)   . Chronic hyponatremia   . Closed fracture of multiple ribs of left side    With routine healing, subsequent encounter  . GERD (gastroesophageal reflux disease)   . Hypertension   . Hypomagnesemia   . Multiple fractures of ribs of left side 09/2015   Initial encounter for closed fracture  . Syncope   . Syncope and collapse   . Thrombocytopenia (HCC)   . Vitamin D deficiency      CURRENT MEDICATIONS: Reviewed  Patient's Medications  New Prescriptions   No medications on file  Previous Medications    ACETAMINOPHEN (TYLENOL) 325 MG TABLET    Take 650 mg by mouth 3 (three) times daily.   ALBUTEROL (PROVENTIL HFA;VENTOLIN HFA) 108 (90 BASE) MCG/ACT INHALER    Inhale 2 puffs into the lungs every 6 (six) hours as needed for wheezing or shortness of breath.   ASPIRIN EC 81 MG TABLET    Take 81 mg by mouth daily.   BENZONATATE (TESSALON) 100 MG CAPSULE    Take 100 mg by mouth every 6 (six) hours as needed for cough.   CETIRIZINE (ZYRTEC) 10 MG TABLET    Take 10 mg by mouth daily.    ERGOCALCIFEROL (VITAMIN D2) 50000 UNITS CAPSULE    Take 50,000 Units by mouth once a week.   FLUTICASONE (FLONASE) 50 MCG/ACT NASAL SPRAY    Place 1 spray into both nostrils 2 (two) times daily.   IRON POLYSACCHARIDES (NIFEREX) 150 MG CAPSULE    Take 150 mg by mouth daily.   LEVOTHYROXINE (SYNTHROID, LEVOTHROID) 25 MCG TABLET    Take 25 mcg by mouth daily before breakfast.   MAGNESIUM OXIDE (MAG-OX) 400 MG TABLET    Take 400 mg by mouth 3 (three) times daily.   METOPROLOL TARTRATE (LOPRESSOR) 25 MG TABLET    Take 25 mg by mouth 2 (two) times daily.    OMEPRAZOLE (PRILOSEC) 40 MG CAPSULE    Take 40 mg by mouth daily.   OXYCODONE (OXY-IR) 5 MG CAPSULE  Take 5 mg by mouth every 4 (four) hours as needed for pain.    POTASSIUM CHLORIDE SA (K-DUR,KLOR-CON) 20 MEQ TABLET    Take 20 mEq by mouth daily.     TORSEMIDE (DEMADEX) 10 MG TABLET    Take 10 mg by mouth daily.   Modified Medications   No medications on file  Discontinued Medications   No medications on file     Allergies  Allergen Reactions  . Ace Inhibitors   . Other     Pt cannot take any products that affect her liver     REVIEW OF SYSTEMS:  GENERAL: no change in appetite, no fatigue, no weight changes, no fever, chills or weakness EYES: Denies change in vision, dry eyes, eye pain, itching or discharge EARS: Denies change in hearing, ringing in ears, or earache NOSE: Denies nasal congestion or epistaxis MOUTH and THROAT: Denies oral discomfort,  gingival pain or bleeding, pain from teeth or hoarseness   RESPIRATORY: no cough, SOB, DOE, wheezing, hemoptysis CARDIAC: no chest pain, edema or palpitations GI: no abdominal pain, diarrhea, constipation, heart burn, nausea or vomiting GU: Denies dysuria, frequency, hematuria, incontinence, or discharge PSYCHIATRIC: Denies feeling of depression or anxiety. No report of hallucinations, insomnia, paranoia, or agitation   PHYSICAL EXAMINATION  GENERAL APPEARANCE: Well nourished. In no acute distress. Normal body habitus SKIN:  Skin is warm and dry.  HEAD: Normal in size and contour. No evidence of trauma EYES: Lids open and close normally. No blepharitis, entropion or ectropion. PERRL. Conjunctivae are clear and sclerae are white. Lenses are without opacity EARS: Pinnae are normal. Patient hears normal voice tunes of the examiner MOUTH and THROAT: Lips are without lesions. Oral mucosa is moist and without lesions. Tongue is normal in shape, size, and color and without lesions NECK: supple, trachea midline, no neck masses, no thyroid tenderness, no thyromegaly LYMPHATICS: no LAN in the neck, no supraclavicular LAN RESPIRATORY: breathing is even & unlabored, BS CTAB CARDIAC: RRR, no murmur,no extra heart sounds, no edema GI: abdomen soft, normal BS, no tenderness, no hepatomegaly, no splenomegaly, has abdominal hernia  EXTREMITIES:  Able to move X 4 extremities PSYCHIATRIC: Alert and oriented X 3. Affect and behavior are appropriate  LABS/RADIOLOGY: Labs reviewed: Basic Metabolic Panel:  Recent Labs  21/30/8607/10/23 09/19/15 09/24/15  NA 137 132* 139  K 3.3* 3.5 3.7  BUN 16 13 18   CREATININE 1.1 1.0 1.1   Liver Function Tests:  Recent Labs  09/14/15 09/24/15  AST 24 19  ALT 13 11  ALKPHOS 97 79   CBC:  Recent Labs  09/16/15 09/19/15 09/24/15  WBC 5.7 5.4 5.4  HGB 9.8* 10.4* 9.4*  HCT 27* 28* 26*  PLT 122* 152 175     ASSESSMENT/PLAN:  Hypomagnesemia - increase Magnesium  400 mg 1 tab PO from TID to QID X 3 days the Magnesium 400 mg 1 tab PO TID; Mg level in 1 week     Kenard GowerMonina Medina-Vargas, NP [facility] [phone]

## 2015-09-27 ENCOUNTER — Non-Acute Institutional Stay (SKILLED_NURSING_FACILITY): Payer: Medicare HMO | Admitting: Adult Health

## 2015-09-27 ENCOUNTER — Encounter: Payer: Self-pay | Admitting: Adult Health

## 2015-09-27 DIAGNOSIS — D638 Anemia in other chronic diseases classified elsewhere: Secondary | ICD-10-CM

## 2015-09-27 DIAGNOSIS — E038 Other specified hypothyroidism: Secondary | ICD-10-CM | POA: Diagnosis not present

## 2015-09-27 DIAGNOSIS — I482 Chronic atrial fibrillation, unspecified: Secondary | ICD-10-CM

## 2015-09-27 DIAGNOSIS — I50810 Right heart failure, unspecified: Secondary | ICD-10-CM

## 2015-09-27 DIAGNOSIS — R5381 Other malaise: Secondary | ICD-10-CM

## 2015-09-27 DIAGNOSIS — E876 Hypokalemia: Secondary | ICD-10-CM | POA: Diagnosis not present

## 2015-09-27 DIAGNOSIS — I509 Heart failure, unspecified: Secondary | ICD-10-CM | POA: Diagnosis not present

## 2015-09-27 DIAGNOSIS — K219 Gastro-esophageal reflux disease without esophagitis: Secondary | ICD-10-CM

## 2015-09-27 DIAGNOSIS — I10 Essential (primary) hypertension: Secondary | ICD-10-CM | POA: Diagnosis not present

## 2015-09-27 DIAGNOSIS — S2232XS Fracture of one rib, left side, sequela: Secondary | ICD-10-CM

## 2015-09-27 NOTE — Progress Notes (Signed)
Patient ID: Monica Ingram, female   DOB: [birth year], 75 y.o.   MRN: [id number]    DATE:  09/27/2015   MRN:  [id number]  BIRTHDAY: 11-08-1940  Facility:  Nursing Home Location:  [facility]  Nursing Home Room Number: 808-P  LEVEL OF CARE:  SNF [phone]  Contact Information    Name Relation Home Work Mobile   [place] Daughter [phone]     Elyse Jarvis Daughter [phone]       Code Status History    This patient does not have a recorded code status. Please follow your organizational policy for patients in this situation.       Chief Complaint  Patient presents with  . Discharge Note    HISTORY OF PRESENT ILLNESS:  This is a 75 year old female who is for discharge home with Home health PT, OT and Nursing.  She was recently supplemented with Mg due to mg level of 1.2. She needs to have Mg level re-check in 1 week.  She has been admitted to [facility] on 09/19/15 from [facility] with post syncope with fall and left multiple ribs 9th-11th fracture. Echocardiogram showed severe TR with right ventricular volume overload and normal LVF. Cardiology was consulted and recommended no additional work-up. She was diuresed and had acute renal impairment.  Patient was admitted to this facility for short-term rehabilitation after the patient's recent hospitalization.  Patient has completed SNF rehabilitation and therapy has cleared the patient for discharge.   PAST MEDICAL HISTORY:  Past Medical History:  Diagnosis Date  . Acquired hypothyroidism   . Anemia of chronic disease   . Chronic atrial fibrillation (HCC)   . Chronic hyponatremia   . Closed fracture of multiple ribs of left side    With routine healing, subsequent encounter  . GERD (gastroesophageal reflux disease)   . Hypertension   . Hypomagnesemia   . Multiple fractures of ribs of left side 09/2015   Initial encounter for closed fracture  .  Syncope   . Syncope and collapse   . Thrombocytopenia (HCC)   . Vitamin D deficiency      CURRENT MEDICATIONS: Reviewed  Patient's Medications  New Prescriptions   No medications on file  Previous Medications   ACETAMINOPHEN (TYLENOL) 325 MG TABLET    Take 650 mg by mouth 3 (three) times daily.   ALBUTEROL (PROVENTIL HFA;VENTOLIN HFA) 108 (90 BASE) MCG/ACT INHALER    Inhale 2 puffs into the lungs every 6 (six) hours as needed for wheezing or shortness of breath.   ASPIRIN EC 81 MG TABLET    Take 81 mg by mouth daily.   BENZONATATE (TESSALON) 100 MG CAPSULE    Take 100 mg by mouth every 6 (six) hours as needed for cough.   CETIRIZINE (ZYRTEC) 10 MG TABLET    Take 10 mg by mouth daily.    ERGOCALCIFEROL (VITAMIN D2) 50000 UNITS CAPSULE    Take 50,000 Units by mouth once a week.   FLUTICASONE (FLONASE) 50 MCG/ACT NASAL SPRAY    Place 1 spray into both nostrils 2 (two) times daily.   IRON POLYSACCHARIDES (NIFEREX) 150 MG CAPSULE    Take 150 mg by mouth daily.   LEVOTHYROXINE (SYNTHROID, LEVOTHROID) 25 MCG TABLET    Take 25 mcg by mouth daily before breakfast.   MAGNESIUM OXIDE (MAG-OX) 400 MG TABLET    Take 400 mg by mouth 3 (three) times daily.   METOPROLOL (LOPRESSOR) 50 MG TABLET  Take 50 mg by mouth.   OMEPRAZOLE (PRILOSEC) 40 MG CAPSULE    Take 40 mg by mouth daily.    OXYCODONE (OXY-IR) 5 MG CAPSULE    Take 5 mg by mouth every 4 (four) hours as needed for pain.    POTASSIUM CHLORIDE SA (K-DUR,KLOR-CON) 20 MEQ TABLET    Take 20 mEq by mouth daily.     TORSEMIDE (DEMADEX) 10 MG TABLET    Take 10 mg by mouth daily.   Modified Medications   No medications on file  Discontinued Medications   METOPROLOL TARTRATE (LOPRESSOR) 25 MG TABLET    Take 25 mg by mouth 2 (two) times daily.      Allergies  Allergen Reactions  . Ace Inhibitors   . Other     Pt cannot take any products that affect her liver     REVIEW OF SYSTEMS:  GENERAL: no change in appetite, no fatigue, no weight  changes, no fever, chills or weakness EYES: Denies change in vision, dry eyes, eye pain, itching or discharge EARS: Denies change in hearing, ringing in ears, or earache NOSE: Denies nasal congestion or epistaxis MOUTH and THROAT: Denies oral discomfort, gingival pain or bleeding, pain from teeth or hoarseness   RESPIRATORY: no cough, SOB, DOE, wheezing, hemoptysis CARDIAC: no chest pain, edema or palpitations GI: no abdominal pain, diarrhea, constipation, heart burn, nausea or vomiting GU: Denies dysuria, frequency, hematuria, incontinence, or discharge PSYCHIATRIC: Denies feeling of depression or anxiety. No report of hallucinations, insomnia, paranoia, or agitation   PHYSICAL EXAMINATION  GENERAL APPEARANCE: Well nourished. In no acute distress. Normal body habitus SKIN:  Skin is warm and dry.  HEAD: Normal in size and contour. No evidence of trauma EYES: Lids open and close normally. No blepharitis, entropion or ectropion. PERRL. Conjunctivae are clear and sclerae are white. Lenses are without opacity EARS: Pinnae are normal. Patient hears normal voice tunes of the examiner MOUTH and THROAT: Lips are without lesions. Oral mucosa is moist and without lesions. Tongue is normal in shape, size, and color and without lesions NECK: supple, trachea midline, no neck masses, no thyroid tenderness, no thyromegaly LYMPHATICS: no LAN in the neck, no supraclavicular LAN RESPIRATORY: breathing is even & unlabored, BS CTAB CARDIAC: RRR, no murmur,no extra heart sounds, no edema GI: abdomen soft, normal BS, no tenderness, no hepatomegaly, no splenomegaly, has abdominal hernia  EXTREMITIES:  Able to move X 4 extremities PSYCHIATRIC: Alert and oriented X 3. Affect and behavior are appropriate  LABS/RADIOLOGY: Labs reviewed: Basic Metabolic Panel:  Recent Labs  04/54/907/10/23 09/19/15 09/24/15  NA 137 132* 139  K 3.3* 3.5 3.7  BUN 16 13 18   CREATININE 1.1 1.0 1.1   Liver Function Tests:  Recent  Labs  09/14/15 09/24/15  AST 24 19  ALT 13 11  ALKPHOS 97 79   CBC:  Recent Labs  09/16/15 09/19/15 09/24/15  WBC 5.7 5.4 5.4  HGB 9.8* 10.4* 9.4*  HCT 27* 28* 26*  PLT 122* 152 175     ASSESSMENT/PLAN:  Physical deconditioning - for Home health PT, OT and Nursing, for therapeutic strengthening exercises; fall precaution  Left rib fracture - continue Oxycodone 5 mg 1 tab PO Q 4 hours PRN for pain  Right ventricular failure - continue Lopressor 50 mg 1 tab PO BID and Torsemide 10 mg 1 tab daily  Chronic atrial fibrillation - rate-controlled; continue Lopressor 50 mg 1 tab PO BID and ASA EC 81 mg 1 tab PO daily  Hypokalemia -  Continue KCL ER 20 meq 1 tab PO Q D Lab Results  Component Value Date   K 3.7 09/24/2015   Hypomagnesemia - continue Magnesium 400 mg 1 tab PO TID; Magnesium level in 1 week  Anemia of chronic disease -  stable Lab Results  Component Value Date   HGB 9.4 (A) 09/24/2015   Hypertension - well-controlled; continue Metoprolol 50 mg 1 tab PO BID  GERD - continue Omeprazole 1 capsule PO daily  Hypothyroidism - continue Synthroid 25 mcg 1 tab daily Lab Results  Component Value Date   TSH 1.51 09/24/2015       I have filled out patient's discharge paperwork and written prescriptions.  Patient will receive home health PT, OT and Nursing.  DME provided:  None  Total discharge time: Less than 30 minutes  Discharge time involved coordination of the discharge process with social worker, nursing staff and therapy department. Medical justification for home health services verified.     Kenard Gower, NP [employer] [phone]

## 2015-10-06 ENCOUNTER — Other Ambulatory Visit: Payer: Self-pay | Admitting: Adult Health

## 2015-11-12 ENCOUNTER — Other Ambulatory Visit: Payer: Self-pay | Admitting: Adult Health

## 2016-03-26 ENCOUNTER — Other Ambulatory Visit: Payer: Self-pay | Admitting: Adult Health

## 2016-08-30 ENCOUNTER — Other Ambulatory Visit: Payer: Self-pay | Admitting: Adult Health

## 2018-11-09 ENCOUNTER — Emergency Department (HOSPITAL_COMMUNITY): Payer: Medicare HMO

## 2018-11-09 ENCOUNTER — Encounter (HOSPITAL_COMMUNITY): Payer: Self-pay | Admitting: Radiology

## 2018-11-09 ENCOUNTER — Emergency Department (HOSPITAL_COMMUNITY)
Admission: EM | Admit: 2018-11-09 | Discharge: 2018-12-07 | Disposition: E | Payer: Medicare HMO | Attending: Emergency Medicine | Admitting: Emergency Medicine

## 2018-11-09 DIAGNOSIS — Z7982 Long term (current) use of aspirin: Secondary | ICD-10-CM | POA: Insufficient documentation

## 2018-11-09 DIAGNOSIS — Y9389 Activity, other specified: Secondary | ICD-10-CM | POA: Diagnosis not present

## 2018-11-09 DIAGNOSIS — I468 Cardiac arrest due to other underlying condition: Secondary | ICD-10-CM | POA: Diagnosis not present

## 2018-11-09 DIAGNOSIS — Z20828 Contact with and (suspected) exposure to other viral communicable diseases: Secondary | ICD-10-CM | POA: Diagnosis not present

## 2018-11-09 DIAGNOSIS — I10 Essential (primary) hypertension: Secondary | ICD-10-CM | POA: Diagnosis not present

## 2018-11-09 DIAGNOSIS — S065X9A Traumatic subdural hemorrhage with loss of consciousness of unspecified duration, initial encounter: Secondary | ICD-10-CM

## 2018-11-09 DIAGNOSIS — S0990XA Unspecified injury of head, initial encounter: Secondary | ICD-10-CM | POA: Diagnosis present

## 2018-11-09 DIAGNOSIS — W0110XA Fall on same level from slipping, tripping and stumbling with subsequent striking against unspecified object, initial encounter: Secondary | ICD-10-CM | POA: Insufficient documentation

## 2018-11-09 DIAGNOSIS — E039 Hypothyroidism, unspecified: Secondary | ICD-10-CM | POA: Diagnosis not present

## 2018-11-09 DIAGNOSIS — Z79899 Other long term (current) drug therapy: Secondary | ICD-10-CM | POA: Diagnosis not present

## 2018-11-09 DIAGNOSIS — R404 Transient alteration of awareness: Secondary | ICD-10-CM

## 2018-11-09 DIAGNOSIS — S06360A Traumatic hemorrhage of cerebrum, unspecified, without loss of consciousness, initial encounter: Secondary | ICD-10-CM | POA: Diagnosis not present

## 2018-11-09 DIAGNOSIS — Y999 Unspecified external cause status: Secondary | ICD-10-CM | POA: Diagnosis not present

## 2018-11-09 DIAGNOSIS — S065XAA Traumatic subdural hemorrhage with loss of consciousness status unknown, initial encounter: Secondary | ICD-10-CM

## 2018-11-09 DIAGNOSIS — Y92003 Bedroom of unspecified non-institutional (private) residence as the place of occurrence of the external cause: Secondary | ICD-10-CM | POA: Insufficient documentation

## 2018-11-09 LAB — COMPREHENSIVE METABOLIC PANEL
ALT: 8 U/L (ref 0–44)
AST: 18 U/L (ref 15–41)
Albumin: 2.3 g/dL — ABNORMAL LOW (ref 3.5–5.0)
Alkaline Phosphatase: 228 U/L — ABNORMAL HIGH (ref 38–126)
Anion gap: 14 (ref 5–15)
BUN: 8 mg/dL (ref 8–23)
CO2: 26 mmol/L (ref 22–32)
Calcium: 11 mg/dL — ABNORMAL HIGH (ref 8.9–10.3)
Chloride: 97 mmol/L — ABNORMAL LOW (ref 98–111)
Creatinine, Ser: 0.84 mg/dL (ref 0.44–1.00)
GFR calc Af Amer: >60 mL/min (ref >60)
GFR calc non Af Amer: >60 mL/min (ref >60)
Glucose, Bld: 213 mg/dL — ABNORMAL HIGH (ref 70–99)
Potassium: 3.6 mmol/L (ref 3.5–5.1)
Sodium: 137 mmol/L (ref 135–145)
Total Bilirubin: 0.8 mg/dL (ref 0.3–1.2)
Total Protein: 5.9 g/dL — ABNORMAL LOW (ref 6.5–8.1)

## 2018-11-09 LAB — CBC WITH DIFFERENTIAL/PLATELET
Abs Immature Granulocytes: 0 10*3/uL (ref 0.00–0.07)
Basophils Absolute: 0 10*3/uL (ref 0.0–0.1)
Basophils Relative: 0 %
Eosinophils Absolute: 0 10*3/uL (ref 0.0–0.5)
Eosinophils Relative: 0 %
HCT: 22.4 % — ABNORMAL LOW (ref 36.0–46.0)
Hemoglobin: 7.2 g/dL — ABNORMAL LOW (ref 12.0–15.0)
Immature Granulocytes: 0 %
Lymphocytes Relative: 82 %
Lymphs Abs: 2.9 10*3/uL (ref 0.7–4.0)
MCH: 29.8 pg (ref 26.0–34.0)
MCHC: 32.1 g/dL (ref 30.0–36.0)
MCV: 92.6 fL (ref 80.0–100.0)
Monocytes Absolute: 0.6 10*3/uL (ref 0.1–1.0)
Monocytes Relative: 18 %
Neutro Abs: 0 10*3/uL — ABNORMAL LOW (ref 1.7–7.7)
Neutrophils Relative %: 0 %
Platelets: 31 10*3/uL — ABNORMAL LOW (ref 150–400)
RBC: 2.42 MIL/uL — ABNORMAL LOW (ref 3.87–5.11)
RDW: 15.2 % (ref 11.5–15.5)
WBC: 3.6 10*3/uL — ABNORMAL LOW (ref 4.0–10.5)
nRBC: 0 % (ref 0.0–0.2)

## 2018-11-09 LAB — POCT I-STAT 7, (LYTES, BLD GAS, ICA,H+H)
Acid-base deficit: 2 mmol/L (ref 0.0–2.0)
Bicarbonate: 26.7 mmol/L (ref 20.0–28.0)
Calcium, Ion: 1.58 mmol/L (ref 1.15–1.40)
HCT: 19 % — ABNORMAL LOW (ref 36.0–46.0)
Hemoglobin: 6.5 g/dL — CL (ref 12.0–15.0)
O2 Saturation: 100 %
Patient temperature: 95.3
Potassium: 3.5 mmol/L (ref 3.5–5.1)
Sodium: 137 mmol/L (ref 135–145)
TCO2: 29 mmol/L (ref 22–32)
pCO2 arterial: 67 mmHg (ref 32.0–48.0)
pH, Arterial: 7.197 — CL (ref 7.350–7.450)
pO2, Arterial: 536 mmHg — ABNORMAL HIGH (ref 83.0–108.0)

## 2018-11-09 LAB — PROTIME-INR
INR: 1.6 — ABNORMAL HIGH (ref 0.8–1.2)
Prothrombin Time: 18.8 seconds — ABNORMAL HIGH (ref 11.4–15.2)

## 2018-11-09 LAB — TROPONIN I (HIGH SENSITIVITY)
Troponin I (High Sensitivity): 18 ng/L — ABNORMAL HIGH (ref <18)
Troponin I (High Sensitivity): 185 ng/L (ref <18)

## 2018-11-09 LAB — SARS CORONAVIRUS 2 BY RT PCR (HOSPITAL ORDER, PERFORMED IN ~~LOC~~ HOSPITAL LAB): SARS Coronavirus 2: NEGATIVE

## 2018-11-09 LAB — LACTIC ACID, PLASMA
Lactic Acid, Venous: 7.4 mmol/L (ref 0.5–1.9)
Lactic Acid, Venous: 3.8 mmol/L (ref 0.5–1.9)

## 2018-11-09 MED ORDER — MIDAZOLAM HCL 2 MG/2ML IJ SOLN
2.0000 mg | Freq: Once | INTRAMUSCULAR | Status: AC
  Administered 2018-11-09: 08:00:00 2 mg via INTRAVENOUS

## 2018-11-09 MED ORDER — GLYCOPYRROLATE 0.2 MG/ML IJ SOLN
0.2000 mg | INTRAMUSCULAR | Status: DC | PRN
  Administered 2018-11-09: 11:00:00 0.2 mg via INTRAVENOUS
  Filled 2018-11-09: qty 1

## 2018-11-09 MED ORDER — LORAZEPAM 2 MG/ML IJ SOLN
1.0000 mg | INTRAMUSCULAR | Status: DC | PRN
  Administered 2018-11-09: 11:00:00 1 mg via INTRAVENOUS
  Filled 2018-11-09: qty 1

## 2018-11-09 MED ORDER — GLYCOPYRROLATE 0.2 MG/ML IJ SOLN: 0.2000 mg | INTRAMUSCULAR | Status: DC | PRN

## 2018-11-09 MED ORDER — EPINEPHRINE 1 MG/10ML IJ SOSY
PREFILLED_SYRINGE | INTRAMUSCULAR | Status: AC | PRN
  Administered 2018-11-09: 1 mg via INTRAVENOUS

## 2018-11-09 MED ORDER — LORAZEPAM 1 MG PO TABS: 1.0000 mg | ORAL_TABLET | ORAL | Status: DC | PRN

## 2018-11-09 MED ORDER — CALCIUM CHLORIDE 10 % IV SOLN
INTRAVENOUS | Status: AC | PRN
  Administered 2018-11-09: 1 g via INTRAVENOUS

## 2018-11-09 MED ORDER — LORAZEPAM 2 MG/ML PO CONC: 1.0000 mg | ORAL | Status: DC | PRN

## 2018-11-09 MED ORDER — GLYCOPYRROLATE 1 MG PO TABS
1.0000 mg | ORAL_TABLET | ORAL | Status: DC | PRN
  Filled 2018-11-09: qty 1

## 2018-11-09 MED ORDER — MIDAZOLAM HCL 2 MG/2ML IJ SOLN
INTRAMUSCULAR | Status: AC
  Filled 2018-11-09: qty 2

## 2018-11-09 MED FILL — Medication: Qty: 1 | Status: AC

## 2018-12-07 NOTE — Progress Notes (Signed)
Chaplain responded to notification from RN that pt was an incoming CPR in progress. [name] arrived on the CPR machine and staff took over CPR working to stabilize Monica Ingram. Chaplain sat with [name] in consult space B. Chaplain offered coffee. MD and Chaplain returned to consult B to update Seth Bake on her mother's status. More family was called in and [name] status was changed to comfort care. Chaplain Estill Bamberg handed this case off to on-coming [employer] at the change of shift. Chaplains remain available for support as needed.   Chaplain Resident, Evelene Croon, M. Div.

## 2018-12-07 NOTE — ED Notes (Signed)
Neurosurgeon at bedside, he spoke with Dr Stark Jock and pt is going to be comfort care.

## 2018-12-07 NOTE — Code Documentation (Signed)
7.5 ET tube placed- + color change.

## 2018-12-07 NOTE — Code Documentation (Signed)
Pulse check asystole

## 2018-12-07 NOTE — ED Notes (Signed)
Pt's Daughter at bedside.

## 2018-12-07 NOTE — Progress Notes (Signed)
Chaplain was with the family to offer prayer for them and their loved ones as they are facing a potential endo of life for the patient.  The chaplain provided both emotional and spiritual support.  The chaplain is available for follow-up if needed.  Brion Aliment Chaplain Resident For questions concerning this note please contact me by pager [phone]

## 2018-12-07 NOTE — ED Notes (Signed)
Lab called this RN for troponin of 185

## 2018-12-07 NOTE — ED Notes (Signed)
Family at bedside, this RN and Barista at bedside for extubation. ETT removed, no effort of breathing by patient. Pt still has pulse, Family visiting at bedside.

## 2018-12-07 NOTE — ED Notes (Signed)
Pt has very faint carotid pulse

## 2018-12-07 NOTE — Code Documentation (Signed)
Pt arrives to trauma bay receiving CPR-lucas in place pt being bagged.

## 2018-12-07 NOTE — ED Notes (Signed)
Lab called this RN and notified about Lactic Acid of 7.4

## 2018-12-07 NOTE — ED Provider Notes (Signed)
7:27 AM Care assumed from Dr. Stark Jock.  At time of transfer of care, patient has been intubated for unresponsiveness, postarrest after fall with subdural and intracranial hemorrhage.  Patient is awaiting evaluation by neurosurgery.  Anticipate follow-up on the recommendations.  She is full code to everyone's knowledge.  8:02 AM Was just informed by nursing and previous provider who spoke with neurosurgery who reports that patient is not going to have a neurosurgical procedure and will be transitioned to comfort care.  They are waiting for family to arrive in the next 30 minutes to psych by the patient before patient will be extubated in the emergency department.  Anticipate admission to medicine service for palliative monitoring and letting the family be with patient until she expires.  10:18 AM All the patient's family has been able to see the patient.  I confirmed that she is now DNR and placed appropriate orders.  Palliative order set was utilized for anticholinergic and anxiety medication.  Patient will be compassionately extubated and will be monitored.  If she does not expire quickly, will speak with medicine about admission for further palliative management.  11:06 AM I just reexamined the patient and she had no pulses.  She was apneic and was not breathing.  Patient had no response to pain, corneal reflex, or any response.  Time of death was declared at 11:04 AM.  Numerous other members including daughter was present.  We will try to get in touch with the PCP for death certificate however we do not get in touch with them.   11:25 AM Spoke with Dr. Loistine Simas who is the PCP who agreed to felt the death certificate.  Patient expired with family surrounding her.   Clinical Impression: 1. Subdural hematoma ([place])   2. Altered level of consciousness     Disposition: Expired    Amany Rando, Gwenyth Allegra, MD 2018/11/14 [phone]

## 2018-12-07 NOTE — ED Notes (Signed)
Pt taken to CT with RT, MD Delo and emt. Pt back in trauma room at this time. MD Delo aware that family is here in consult room

## 2018-12-07 NOTE — ED Notes (Signed)
Called portable xray- will verify tube and then go for a stat CT

## 2018-12-07 NOTE — Code Documentation (Signed)
Pulse check: faint pulse felt. Preparing to intubate pt.

## 2018-12-07 NOTE — Code Documentation (Addendum)
Port accessed in upper right chest- pt is a chemo patient. Hx of leukemia  Family heard pt fall in bathroom went in to check on her and found her on the found with large amount of blood-pt pt was awake at first telling family to not call ems. Shortly after pt became altered ems was call- on their arrival gcs 10 en route to ED pt respiratory arrested and required being bagged when ems pulled into ED pt lost pulses.

## 2018-12-07 NOTE — Consult Note (Signed)
Reason for Consult:subdural hematoma Referring Physician: Trea Carnegie is an 77 y.o. female.  HPI: found down at home by family member, responsive initially with acute decline in mental status. She was coded in route for pea. Intubated at [facility], head ct showed massive subdural hematoma.   Past Medical History:  Diagnosis Date  . Acquired hypothyroidism   . Anemia of chronic disease   . Chronic atrial fibrillation ([place])   . Chronic hyponatremia   . Closed fracture of multiple ribs of left side    With routine healing, subsequent encounter  . GERD (gastroesophageal reflux disease)   . Hypertension   . Hypomagnesemia   . Multiple fractures of ribs of left side 09/2015   Initial encounter for closed fracture  . Syncope   . Syncope and collapse   . Thrombocytopenia ([place])   . Vitamin D deficiency     Past Surgical History:  Procedure Laterality Date  . CHOLECYSTECTOMY      No family history on file.  Social History:  reports that she has never smoked. She does not have any smokeless tobacco history on file. She reports that she does not drink alcohol or use drugs.  Allergies:  Allergies  Allergen Reactions  . Ace Inhibitors   . Other     Pt cannot take any products that affect her liver    Medications: I have reviewed the patient's current medications.  Results for orders placed or performed during the hospital encounter of 11-22-2018 (from the past 48 hour(s))  I-STAT 7, (LYTES, BLD GAS, ICA, H+H)     Status: Abnormal   Collection Time: 11/22/18  6:30 AM  Result Value Ref Range   pH, Arterial 7.197 (LL) 7.350 - 7.450   pCO2 arterial 67.0 (HH) 32.0 - 48.0 mmHg   pO2, Arterial 536.0 (H) 83.0 - 108.0 mmHg   Bicarbonate 26.7 20.0 - 28.0 mmol/L   TCO2 29 22 - 32 mmol/L   O2 Saturation 100.0 %   Acid-base deficit 2.0 0.0 - 2.0 mmol/L   Sodium 137 135 - 145 mmol/L   Potassium 3.5 3.5 - 5.1 mmol/L   Calcium, Ion 1.58 (HH) 1.15 - 1.40 mmol/L   HCT 19.0 (L) 36.0 - 46.0 %    Hemoglobin 6.5 (LL) 12.0 - 15.0 g/dL   Patient temperature 95.3 F    Collection site RADIAL, ALLEN'S TEST ACCEPTABLE    Drawn by RT    Sample type ARTERIAL    Comment NOTIFIED PHYSICIAN   Comprehensive metabolic panel     Status: Abnormal   Collection Time: November 22, 2018  6:33 AM  Result Value Ref Range   Sodium 137 135 - 145 mmol/L   Potassium 3.6 3.5 - 5.1 mmol/L   Chloride 97 (L) 98 - 111 mmol/L   CO2 26 22 - 32 mmol/L   Glucose, Bld 213 (H) 70 - 99 mg/dL   BUN 8 8 - 23 mg/dL   Creatinine, Ser 0.84 0.44 - 1.00 mg/dL   Calcium 11.0 (H) 8.9 - 10.3 mg/dL   Total Protein 5.9 (L) 6.5 - 8.1 g/dL   Albumin 2.3 (L) 3.5 - 5.0 g/dL   AST 18 15 - 41 U/L   ALT 8 0 - 44 U/L   Alkaline Phosphatase 228 (H) 38 - 126 U/L   Total Bilirubin 0.8 0.3 - 1.2 mg/dL   GFR calc non Af Amer >60 >60 mL/min   GFR calc Af Amer >60 >60 mL/min   Anion gap 14 5 -  15    Comment: Performed at [facility], [address] [place], [place] [address]  CBC with Differential     Status: Abnormal   Collection Time: 08-18-2018  6:33 AM  Result Value Ref Range   WBC 3.6 (L) 4.0 - 10.5 K/uL   RBC 2.42 (L) 3.87 - 5.11 MIL/uL   Hemoglobin 7.2 (L) 12.0 - 15.0 g/dL   HCT 29.522.4 (L) 28.436.0 - 13.246.0 %   MCV 92.6 80.0 - 100.0 fL   MCH 29.8 26.0 - 34.0 pg   MCHC 32.1 30.0 - 36.0 g/dL   RDW 44.015.2 10.211.5 - 72.515.5 %   Platelets 31 (L) 150 - 400 K/uL    Comment: REPEATED TO VERIFY PLATELET COUNT CONFIRMED BY SMEAR Immature Platelet Fraction may be clinically indicated, consider ordering this additional test [id number]    nRBC 0.0 0.0 - 0.2 %   Neutrophils Relative % 0 %   Neutro Abs 0.0 (L) 1.7 - 7.7 K/uL   Lymphocytes Relative 82 %   Lymphs Abs 2.9 0.7 - 4.0 K/uL   Monocytes Relative 18 %   Monocytes Absolute 0.6 0.1 - 1.0 K/uL   Eosinophils Relative 0 %   Eosinophils Absolute 0.0 0.0 - 0.5 K/uL   Basophils Relative 0 %   Basophils Absolute 0.0 0.0 - 0.1 K/uL   Immature Granulocytes 0 %   Abs Immature Granulocytes 0.00  0.00 - 0.07 K/uL    Comment: Performed at [facility], [address] [place], [place] [address]  Troponin I (High Sensitivity)     Status: Abnormal   Collection Time: 08-18-2018  6:33 AM  Result Value Ref Range   Troponin I (High Sensitivity) 18 (H) <18 ng/L    Comment: (NOTE) Elevated high sensitivity troponin I (hsTnI) values and significant  changes across serial measurements may suggest ACS but many other  chronic and acute conditions are known to elevate hsTnI results.  Refer to the "Links" section for chest pain algorithms and additional  guidance. Performed at [facility], [address] [place], [place] [address]   Protime-INR     Status: Abnormal   Collection Time: 08-18-2018  6:33 AM  Result Value Ref Range   Prothrombin Time 18.8 (H) 11.4 - 15.2 seconds   INR 1.6 (H) 0.8 - 1.2    Comment: (NOTE) INR goal varies based on device and disease states. Performed at [facility], [address] [place], [place] [address]   Lactic acid, plasma     Status: Abnormal   Collection Time: 08-18-2018  6:34 AM  Result Value Ref Range   Lactic Acid, Venous 7.4 (HH) 0.5 - 1.9 mmol/L    Comment: CRITICAL RESULT CALLED TO, READ BACK BY AND VERIFIED WITH: COCKRAN,M RN @ [phone] 11/30/2018 LEONARD,A Performed at [facility], [address] [place], [place] [address]   SARS Coronavirus 2 by RT PCR (hospital order, performed in [facility] hospital lab) Nasopharyngeal Nasopharyngeal Swab     Status: None   Collection Time: 08-18-2018  7:09 AM   Specimen: Nasopharyngeal Swab  Result Value Ref Range   SARS Coronavirus 2 NEGATIVE NEGATIVE    Comment: (NOTE) If result is NEGATIVE SARS-CoV-2 target nucleic acids are NOT DETECTED. The SARS-CoV-2 RNA is generally detectable in upper and lower  respiratory specimens during the acute phase of infection. The lowest  concentration of SARS-CoV-2 viral copies this assay can detect is 250  copies / mL. A negative  result does not  preclude SARS-CoV-2 infection  and should not be used as the sole basis for treatment or other  patient management decisions.  A negative result may occur with  improper specimen collection / handling, submission of specimen other  than nasopharyngeal swab, presence of viral mutation(s) within the  areas targeted by this assay, and inadequate number of viral copies  (<250 copies / mL). A negative result must be combined with clinical  observations, patient history, and epidemiological information. If result is POSITIVE SARS-CoV-2 target nucleic acids are DETECTED. The SARS-CoV-2 RNA is generally detectable in upper and lower  respiratory specimens dur ing the acute phase of infection.  Positive  results are indicative of active infection with SARS-CoV-2.  Clinical  correlation with patient history and other diagnostic information is  necessary to determine patient infection status.  Positive results do  not rule out bacterial infection or co-infection with other viruses. If result is PRESUMPTIVE POSTIVE SARS-CoV-2 nucleic acids MAY BE PRESENT.   A presumptive positive result was obtained on the submitted specimen  and confirmed on repeat testing.  While 2019 novel coronavirus  (SARS-CoV-2) nucleic acids may be present in the submitted sample  additional confirmatory testing may be necessary for epidemiological  and / or clinical management purposes  to differentiate between  SARS-CoV-2 and other Sarbecovirus currently known to infect humans.  If clinically indicated additional testing with an alternate test  methodology [phone]) is advised. The SARS-CoV-2 RNA is generally  detectable in upper and lower respiratory sp ecimens during the acute  phase of infection. The expected result is Negative. Fact Sheet for Patients:  BoilerBrush.com.cy Fact Sheet for Healthcare Providers: https://pope.com/ This test is not yet approved or cleared by  the [place] FDA and has been authorized for detection and/or diagnosis of SARS-CoV-2 by FDA under an Emergency Use Authorization (EUA).  This EUA will remain in effect (meaning this test can be used) for the duration of the COVID-19 declaration under Section 564(b)(1) of the Act, 21 U.S.C. section 360bbb-3(b)(1), unless the authorization is terminated or revoked sooner. Performed at [facility], [address] [place], [place] [address]     Ct Head Wo Contrast  Result Date: 2018/11/18 CLINICAL DATA:  Unexplained altered level of consciousness. Fall in bathroom EXAM: CT HEAD WITHOUT CONTRAST CT CERVICAL SPINE WITHOUT CONTRAST TECHNIQUE: Multidetector CT imaging of the head and cervical spine was performed following the standard protocol without intravenous contrast. Multiplanar CT image reconstructions of the cervical spine were also generated. COMPARISON:  05/27/2019 FINDINGS: CT HEAD FINDINGS Brain: Large extra-axial hemorrhage along the right cerebral convexity, subdural in appearance. The hemorrhage is mixed density from blood clot and un clotted components. There is marked mass effect on the brain with 18 mm of left shift. The subdural hemorrhage tracks along the inferior right cerebrum where there appears to be a different layer of subdural dissection. There may also be inferior frontal and temporal contusion. Small volume bleeding along the left occipital lobe which could be subarachnoid or parenchymal. No evidence of complicating infarct. No entrapment changes currently. Vascular: No hyperdense vessel or unexpected calcification. Skull: Large hematoma with gas along the right-sided scalp. No calvarial fracture. Sinuses/Orbits: Bilateral cataract resection Other: Critical Value/emergent results were called by telephone at the time of interpretation on 11-18-18 at 6:52 am to providerDOUGLAS DELO , who was already aware of the findings. CT CERVICAL SPINE FINDINGS Alignment: Reversal  of cervical lordosis with mild C3-4 and C4-5 anterolisthesis which appears  positional or degenerative. Skull base and vertebrae: No acute fracture Soft tissues and spinal canal: No prevertebral fluid or swelling. No visible canal hematoma. Disc levels: Lower cervical disc narrowing and bulging with endplate degeneration. Upper cervical predominant facet arthropathy especially at C3-4. Upper chest: Partially covered right pleural effusion. There is pending chest x-ray IMPRESSION: 1. Large acute subdural hematoma (3.5 cm in thickness) on the right with severe intracranial mass effect causing 18 mm of midline shift. 2. Small volume subarachnoid hemorrhage or contusion in the left occipital lobe. 3. Possible contusion in the inferior right frontal and temporal lobes. 4. No calvarial fracture under the very large scalp hematoma. 5. Negative for cervical spine fracture. Electronically Signed   By: Marnee Spring M.D.   On: 17-Nov-2018 07:00   Ct Cervical Spine Wo Contrast  Result Date: 2018/11/17 CLINICAL DATA:  Unexplained altered level of consciousness. Fall in bathroom EXAM: CT HEAD WITHOUT CONTRAST CT CERVICAL SPINE WITHOUT CONTRAST TECHNIQUE: Multidetector CT imaging of the head and cervical spine was performed following the standard protocol without intravenous contrast. Multiplanar CT image reconstructions of the cervical spine were also generated. COMPARISON:  05/27/2019 FINDINGS: CT HEAD FINDINGS Brain: Large extra-axial hemorrhage along the right cerebral convexity, subdural in appearance. The hemorrhage is mixed density from blood clot and un clotted components. There is marked mass effect on the brain with 18 mm of left shift. The subdural hemorrhage tracks along the inferior right cerebrum where there appears to be a different layer of subdural dissection. There may also be inferior frontal and temporal contusion. Small volume bleeding along the left occipital lobe which could be subarachnoid or  parenchymal. No evidence of complicating infarct. No entrapment changes currently. Vascular: No hyperdense vessel or unexpected calcification. Skull: Large hematoma with gas along the right-sided scalp. No calvarial fracture. Sinuses/Orbits: Bilateral cataract resection Other: Critical Value/emergent results were called by telephone at the time of interpretation on 11-17-2018 at 6:52 am to providerDOUGLAS DELO , who was already aware of the findings. CT CERVICAL SPINE FINDINGS Alignment: Reversal of cervical lordosis with mild C3-4 and C4-5 anterolisthesis which appears positional or degenerative. Skull base and vertebrae: No acute fracture Soft tissues and spinal canal: No prevertebral fluid or swelling. No visible canal hematoma. Disc levels: Lower cervical disc narrowing and bulging with endplate degeneration. Upper cervical predominant facet arthropathy especially at C3-4. Upper chest: Partially covered right pleural effusion. There is pending chest x-ray IMPRESSION: 1. Large acute subdural hematoma (3.5 cm in thickness) on the right with severe intracranial mass effect causing 18 mm of midline shift. 2. Small volume subarachnoid hemorrhage or contusion in the left occipital lobe. 3. Possible contusion in the inferior right frontal and temporal lobes. 4. No calvarial fracture under the very large scalp hematoma. 5. Negative for cervical spine fracture. Electronically Signed   By: Marnee Spring M.D.   On: November 17, 2018 07:00   Dg Chest Port 1 View  Result Date: 11-17-18 CLINICAL DATA:  Hypoxia EXAM: PORTABLE CHEST 1 VIEW COMPARISON:  October 25, 2018 FINDINGS: Endotracheal tube tip is 4.0 cm above the carina. Port-A-Cath tip is at the cavoatrial junction. Nasogastric tube tip is at the gastroesophageal junction with the side port above the diaphragm. There are apparent drains overlying each lower lung zone. No pneumothorax. There is a right pleural effusion with atelectatic change in the right mid and lower  lung zones. Left lung is clear. Heart is mildly enlarged with pulmonary vascularity normal. No adenopathy. No bone lesions. IMPRESSION: Tube and catheter  positions as described without pneumothorax. Note that the nasogastric tube side port is above the diaphragm. Advise advancing nasogastric tube approximately 10 cm. Right pleural effusion with atelectatic change in the right mid lower lung regions. Left lung clear. Stable cardiac prominence. Electronically Signed   By: Bretta Bang III M.D.   On: December 08, 2018 06:59    Review of Systems  Unable to perform ROS: Patient unresponsive   Blood pressure 130/89, pulse 80, resp. rate (!) 26, SpO2 100 %. Physical Exam  Constitutional:  cachectic  HENT:  Swelling around right forehead and eye with ecchymoses  Eyes:  Fixed, dilated 6-7 mm  Neck:  In collar  Respiratory:  Ventilated, not triggering ventilator  Neurological: She is unresponsive. A cranial nerve deficit is present. GCS eye subscore is 1. GCS verbal subscore is 1. GCS motor subscore is 1.  Comatose Gait, strength not assessed  Skin: Skin is warm and dry.    Assessment/Plan: Ms. Meinhardt presents with massive mixed subdural panhemispheric right side   Coletta Memos December 08, 2018, 8:19 AM

## 2018-12-07 NOTE — ED Provider Notes (Signed)
[facility] EMERGENCY DEPARTMENT Provider Note   CSN: [id number] Arrival date & time: 11-11-18  1191     History   Chief Complaint Chief Complaint  Patient presents with   Cardiac Arrest    HPI Kissa Campoy is a 78 y.o. female.     Patient is a 78 year old female with history of acute myelogenous leukemia, chronic A. fib, thrombocytopenia, hypertension.  She is brought by EMS for a fall that occurred at home.  Patient was getting up for some reason this morning when she fell and struck her head.  The patient initially asked family members not to call 911, but the paramedics were called and the patient was transported here.  While in transport, the patient became less responsive and required ventilatory assistance with bag-valve-mask.  She also lost pulses and CPR was initiated.  Patient arrived here with full CPR in progress being ventilated by bag valve mask.  Patient's GCS is 3 upon arrival and adds no additional history.  The history is provided by the patient.    Past Medical History:  Diagnosis Date   Acquired hypothyroidism    Anemia of chronic disease    Chronic atrial fibrillation (HCC)    Chronic hyponatremia    Closed fracture of multiple ribs of left side    With routine healing, subsequent encounter   GERD (gastroesophageal reflux disease)    Hypertension    Hypomagnesemia    Multiple fractures of ribs of left side 09/2015   Initial encounter for closed fracture   Syncope    Syncope and collapse    Thrombocytopenia (HCC)    Vitamin D deficiency     Patient Active Problem List   Diagnosis Date Noted   Cognitive impairment 09/20/2015   Chronic a-fib (HCC) 09/20/2015   RVF (right ventricular failure) (HCC) 09/20/2015   Hyponatremia 09/20/2015   Anemia of chronic disease 09/20/2015   Essential hypertension, benign 09/20/2015   Esophageal reflux 09/20/2015   Other specified hypothyroidism 09/20/2015    Past Surgical  History:  Procedure Laterality Date   CHOLECYSTECTOMY       OB History   No obstetric history on file.      Home Medications    Prior to Admission medications   Medication Sig Start Date End Date Taking? Authorizing Provider  albuterol (PROVENTIL HFA;VENTOLIN HFA) 108 (90 Base) MCG/ACT inhaler Inhale 2 puffs into the lungs every 6 (six) hours as needed for wheezing or shortness of breath. 09/20/15 09/30/15  [provider]  aspirin EC 81 MG tablet Take 81 mg by mouth daily.    [provider]  benzonatate (TESSALON) 100 MG capsule Take 100 mg by mouth every 6 (six) hours as needed for cough.    [provider]  cetirizine (ZYRTEC) 10 MG tablet Take 10 mg by mouth daily.     [provider]  ergocalciferol (VITAMIN D2) 50000 units capsule Take 50,000 Units by mouth once a week.    [provider]  fluticasone (FLONASE) 50 MCG/ACT nasal spray Place 1 spray into both nostrils 2 (two) times daily.    [provider]  iron polysaccharides (NIFEREX) 150 MG capsule Take 150 mg by mouth daily.    [provider]  levothyroxine (SYNTHROID, LEVOTHROID) 25 MCG tablet Take 25 mcg by mouth daily before breakfast.    [provider]  magnesium oxide (MAG-OX) 400 MG tablet Take 400 mg by mouth 3 (three) times daily.    [provider]  metoprolol (LOPRESSOR) 50 MG tablet Take 50 mg by mouth.    [provider]  omeprazole (PRILOSEC) 40 MG capsule Take 40 mg by mouth daily.     [provider]  oxycodone (OXY-IR) 5 MG capsule Take 5 mg by mouth every 4 (four) hours as needed for pain.     [provider]  potassium chloride SA (K-DUR,KLOR-CON) 20 MEQ tablet Take 20 mEq by mouth daily.      [provider]  torsemide (DEMADEX) 10 MG tablet Take 10 mg by mouth daily.     [provider]    Family History No family history on file.  Social History Social History   Tobacco Use    Smoking status: Never Smoker  Substance Use Topics   Alcohol use: No   Drug use: No     Allergies   Ace inhibitors and Other   Review of Systems Review of Systems  Unable to perform ROS: Acuity of condition     Physical Exam Updated Vital Signs BP (!) 160/110    Pulse (!) 114    Resp 16    SpO2 96%   Physical Exam Vitals signs and nursing note reviewed.  Constitutional:      General: She is in acute distress.     Appearance: She is well-developed.     Comments: Patient is a chronically ill-appearing 78 year old female.  GCS is 3 and patient is unresponsive.  HENT:     Head: Normocephalic.     Comments: There is a 2.5 cm laceration to the right temple with significant surrounding swelling. Eyes:     Comments: Pupils are mid fixed and nonreactive.  Neck:     Musculoskeletal: Normal range of motion and neck supple.  Cardiovascular:     Rate and Rhythm: Normal rate. Rhythm irregular.  Abdominal:     General: There is no distension.     Palpations: Abdomen is soft.     Tenderness: There is no abdominal tenderness.  Musculoskeletal: Normal range of motion.  Skin:    General: Skin is warm and dry.  Neurological:     Comments: GCS is 3.  Patient with no spontaneous movement and no response to noxious stimuli or loud voice.      ED Treatments / Results  Labs (all labs ordered are listed, but only abnormal results are displayed) Labs Reviewed  POCT I-STAT 7, (LYTES, BLD GAS, ICA,H+H) - Abnormal; Notable for the following components:      Result Value   pH, Arterial 7.197 (*)    pCO2 arterial 67.0 (*)    pO2, Arterial 536.0 (*)    Calcium, Ion 1.58 (*)    HCT 19.0 (*)    Hemoglobin 6.5 (*)    All other components within normal limits  COMPREHENSIVE METABOLIC PANEL  CBC WITH DIFFERENTIAL/PLATELET  LACTIC ACID, PLASMA  LACTIC ACID, PLASMA  BLOOD GAS, ARTERIAL  PROTIME-INR  TROPONIN I (HIGH SENSITIVITY)    EKG EKG Interpretation  Date/Time:  2018-11-20 06:26:30 EST Ventricular Rate:  107 PR Interval:    QRS Duration: 103 QT Interval:  267 QTC Calculation: 357 R Axis:   10 Text Interpretation: Atrial fibrillation Ventricular premature complex Probable anteroseptal infarct, old Borderline repolarization abnormality Confirmed by Veryl Speak [phone]) on 11/20/2018 7:07:30 AM   Radiology Ct Head Wo Contrast  Result Date: 11-20-18 CLINICAL DATA:  Unexplained altered level of consciousness. Fall in bathroom EXAM: CT HEAD WITHOUT CONTRAST CT CERVICAL SPINE WITHOUT  CONTRAST TECHNIQUE: Multidetector CT imaging of the head and cervical spine was performed following the standard protocol without intravenous contrast. Multiplanar CT image reconstructions of the cervical spine were also generated. COMPARISON:  05/27/2019 FINDINGS: CT HEAD FINDINGS Brain: Large extra-axial hemorrhage along the right cerebral convexity, subdural in appearance. The hemorrhage is mixed density from blood clot and un clotted components. There is marked mass effect on the brain with 18 mm of left shift. The subdural hemorrhage tracks along the inferior right cerebrum where there appears to be a different layer of subdural dissection. There may also be inferior frontal and temporal contusion. Small volume bleeding along the left occipital lobe which could be subarachnoid or parenchymal. No evidence of complicating infarct. No entrapment changes currently. Vascular: No hyperdense vessel or unexpected calcification. Skull: Large hematoma with gas along the right-sided scalp. No calvarial fracture. Sinuses/Orbits: Bilateral cataract resection Other: Critical Value/emergent results were called by telephone at the time of interpretation on 12/01/2018 at 6:52 am to providerDOUGLAS Zyann Mabry , who was already aware of the findings. CT CERVICAL SPINE FINDINGS Alignment: Reversal of cervical lordosis with mild C3-4 and C4-5 anterolisthesis which appears positional or degenerative.  Skull base and vertebrae: No acute fracture Soft tissues and spinal canal: No prevertebral fluid or swelling. No visible canal hematoma. Disc levels: Lower cervical disc narrowing and bulging with endplate degeneration. Upper cervical predominant facet arthropathy especially at C3-4. Upper chest: Partially covered right pleural effusion. There is pending chest x-ray IMPRESSION: 1. Large acute subdural hematoma (3.5 cm in thickness) on the right with severe intracranial mass effect causing 18 mm of midline shift. 2. Small volume subarachnoid hemorrhage or contusion in the left occipital lobe. 3. Possible contusion in the inferior right frontal and temporal lobes. 4. No calvarial fracture under the very large scalp hematoma. 5. Negative for cervical spine fracture. Electronically Signed   By: Marnee SpringJonathon  Watts M.D.   On: 11-20-2018 07:00   Ct Cervical Spine Wo Contrast  Result Date: 11/25/2018 CLINICAL DATA:  Unexplained altered level of consciousness. Fall in bathroom EXAM: CT HEAD WITHOUT CONTRAST CT CERVICAL SPINE WITHOUT CONTRAST TECHNIQUE: Multidetector CT imaging of the head and cervical spine was performed following the standard protocol without intravenous contrast. Multiplanar CT image reconstructions of the cervical spine were also generated. COMPARISON:  05/27/2019 FINDINGS: CT HEAD FINDINGS Brain: Large extra-axial hemorrhage along the right cerebral convexity, subdural in appearance. The hemorrhage is mixed density from blood clot and un clotted components. There is marked mass effect on the brain with 18 mm of left shift. The subdural hemorrhage tracks along the inferior right cerebrum where there appears to be a different layer of subdural dissection. There may also be inferior frontal and temporal contusion. Small volume bleeding along the left occipital lobe which could be subarachnoid or parenchymal. No evidence of complicating infarct. No entrapment changes currently. Vascular: No hyperdense  vessel or unexpected calcification. Skull: Large hematoma with gas along the right-sided scalp. No calvarial fracture. Sinuses/Orbits: Bilateral cataract resection Other: Critical Value/emergent results were called by telephone at the time of interpretation on 11/19/2018 at 6:52 am to providerDOUGLAS Ivania Teagarden , who was already aware of the findings. CT CERVICAL SPINE FINDINGS Alignment: Reversal of cervical lordosis with mild C3-4 and C4-5 anterolisthesis which appears positional or degenerative. Skull base and vertebrae: No acute fracture Soft tissues and spinal canal: No prevertebral fluid or swelling. No visible canal hematoma. Disc levels: Lower cervical disc narrowing and bulging with endplate degeneration. Upper cervical predominant facet arthropathy especially  at C3-4. Upper chest: Partially covered right pleural effusion. There is pending chest x-ray IMPRESSION: 1. Large acute subdural hematoma (3.5 cm in thickness) on the right with severe intracranial mass effect causing 18 mm of midline shift. 2. Small volume subarachnoid hemorrhage or contusion in the left occipital lobe. 3. Possible contusion in the inferior right frontal and temporal lobes. 4. No calvarial fracture under the very large scalp hematoma. 5. Negative for cervical spine fracture. Electronically Signed   By: Marnee Spring M.D.   On: Nov 20, 2018 07:00   Dg Chest Port 1 View  Result Date: 11-20-18 CLINICAL DATA:  Hypoxia EXAM: PORTABLE CHEST 1 VIEW COMPARISON:  October 25, 2018 FINDINGS: Endotracheal tube tip is 4.0 cm above the carina. Port-A-Cath tip is at the cavoatrial junction. Nasogastric tube tip is at the gastroesophageal junction with the side port above the diaphragm. There are apparent drains overlying each lower lung zone. No pneumothorax. There is a right pleural effusion with atelectatic change in the right mid and lower lung zones. Left lung is clear. Heart is mildly enlarged with pulmonary vascularity normal. No adenopathy.  No bone lesions. IMPRESSION: Tube and catheter positions as described without pneumothorax. Note that the nasogastric tube side port is above the diaphragm. Advise advancing nasogastric tube approximately 10 cm. Right pleural effusion with atelectatic change in the right mid lower lung regions. Left lung clear. Stable cardiac prominence. Electronically Signed   By: Bretta Bang III M.D.   On: 2018-11-20 06:59    Procedures Procedures (including critical care time)  Medications Ordered in ED Medications  calcium chloride injection (1 g Intravenous Given 11/20/18 0617)  EPINEPHrine (ADRENALIN) 1 MG/10ML injection (1 mg Intravenous Given 2018-11-20 0616)     Initial Impression / Assessment and Plan / ED Course  I have reviewed the triage vital signs and the nursing notes.  Pertinent labs & imaging results that were available during my care of the patient were reviewed by me and considered in my medical decision making (see chart for details).  Patient arrived here by EMS unresponsive after collapsing at home and striking her head.  GCS 3 upon arrival.  She was being ventilated with bag valve mask.  I placed a 7.5 endotracheal tube using a #3 glide scope blade with no sedation.  There was no gag reflex and the tube was easily placed.  Tube placement was confirmed with direct visualization, end-tidal CO2, and direct auscultation over the stomach and lungs.  CPR was in progress upon arrival and continued for several minutes.  Patient was given epinephrine, calcium, and bicarb.  She then achieved return of spontaneous circulation.  She was then taken to radiology for a CT scan of her head.  This revealed a large acute subdural hematoma 3.5 cm in thickness with severe mass-effect causing nearly 2 cm of midline shift.  There is also a large scalp hematoma.  These findings were discussed with Dr. Franky Macho from neurosurgery who is planning to come to the ER to evaluate the situation and discuss options  with the patient.  I personally updated the patient's daughter of the situation and advised her that her prognosis is very grim.  CRITICAL CARE Performed by: Geoffery Lyons Total critical care time: 70 minutes Critical care time was exclusive of separately billable procedures and treating other patients. Critical care was necessary to treat or prevent imminent or life-threatening deterioration. Critical care was time spent personally by me on the following activities: development of treatment plan with patient and/or surrogate  as well as nursing, discussions with consultants, evaluation of patient's response to treatment, examination of patient, obtaining history from patient or surrogate, ordering and performing treatments and interventions, ordering and review of laboratory studies, ordering and review of radiographic studies, pulse oximetry and re-evaluation of patient's condition.   Final Clinical Impressions(s) / ED Diagnoses   Final diagnoses:  None    ED Discharge Orders    None       Geoffery Lyons, MD 2018-12-01 [phone]

## 2018-12-07 NOTE — ED Notes (Signed)
Pt family members at bedside with Chaplain, waiting for 1 more family member to arrive. When family is done with visitation, ET tube will be pulled and pt will be on comfort care.

## 2018-12-07 NOTE — Procedures (Signed)
Extubation Procedure Note  Patient Details:   Name: Monica Ingram DOB: [birth year] MRN: [id number]   Airway Documentation:    Vent end date: 11-10-2018 Vent end time: 1040   Evaluation  O2 sats: currently acceptable Complications: No apparent complications Patient did tolerate procedure well. Bilateral Breath Sounds: Clear   No   Pt compassionately extubated per MD order. RT, RN and family at bedside.  Esperanza Sheets T 10-Nov-2018, 10:41 AM

## 2018-12-07 DEATH — deceased

## 2020-07-24 IMAGING — CT CT CERVICAL SPINE W/O CM
5 of 8 series · 13 of 33 positions shown, 14 images · non-contrast
Comparison: 05/27/2019

CLINICAL DATA: Unexplained altered level of consciousness. Fall in
bathroom

EXAM:
CT HEAD WITHOUT CONTRAST
CT CERVICAL SPINE WITHOUT CONTRAST
TECHNIQUE: Multidetector CT imaging of the head and cervical spine was
performed following the standard protocol without intravenous
contrast. Multiplanar CT image reconstructions of the cervical spine
were also generated.

[Series 4: head bone · axial · 0.41mm/px · z∈[-115,-61]mm · 2 of 83 slices shown]
[im 28/83  bone]
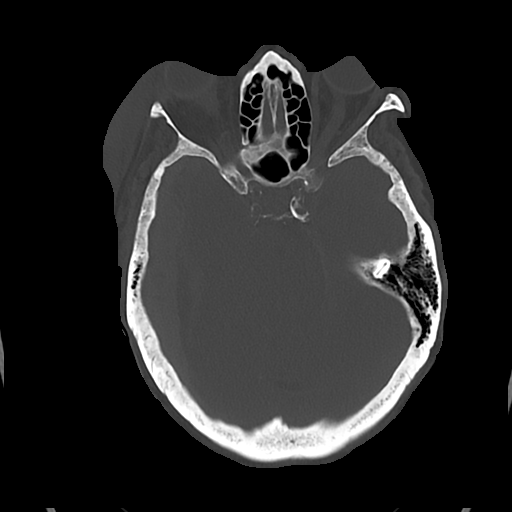
[im 55/83  bone]
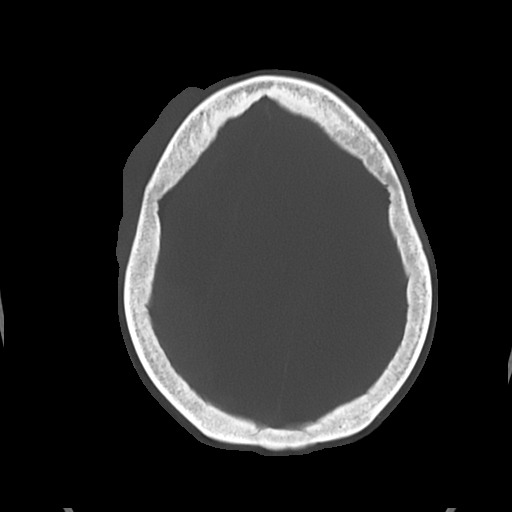

[Series 5: cor soft · coronal · 0.32mm/px · 3 of 67 slices shown]
[im 17/67  bone]
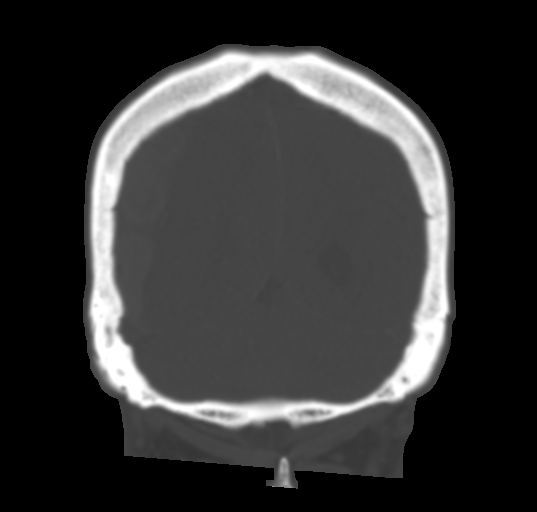
[im 34/67  bone]
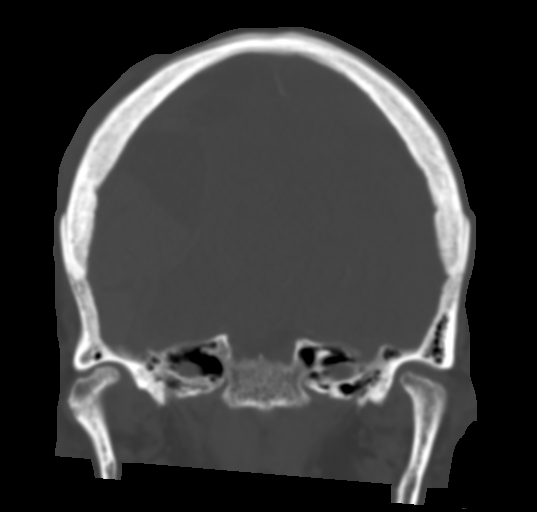
[im 50/67  bone]
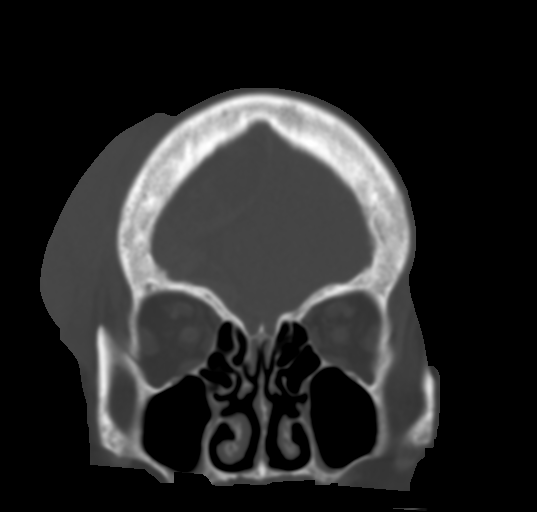

[Series 8: c spine soft · axial · 0.28mm/px · z∈[-222,-174]mm · 2 of 74 slices shown]
[im 25/74  soft-tissue]
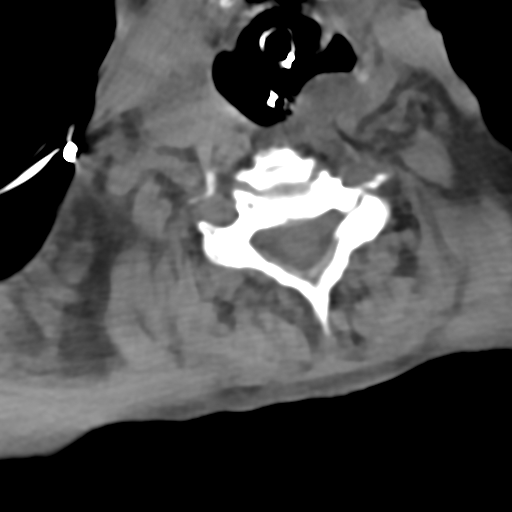
[im 49/74  soft-tissue]
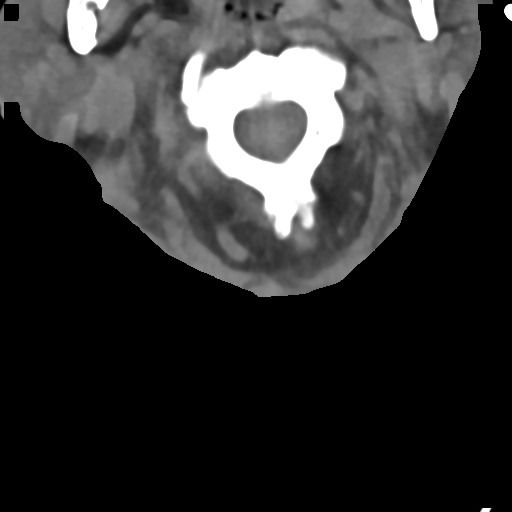

[Series 11: sag bone · sagittal · 0.25mm/px · 4 of 52 slices shown]
[im 11/52  bone]
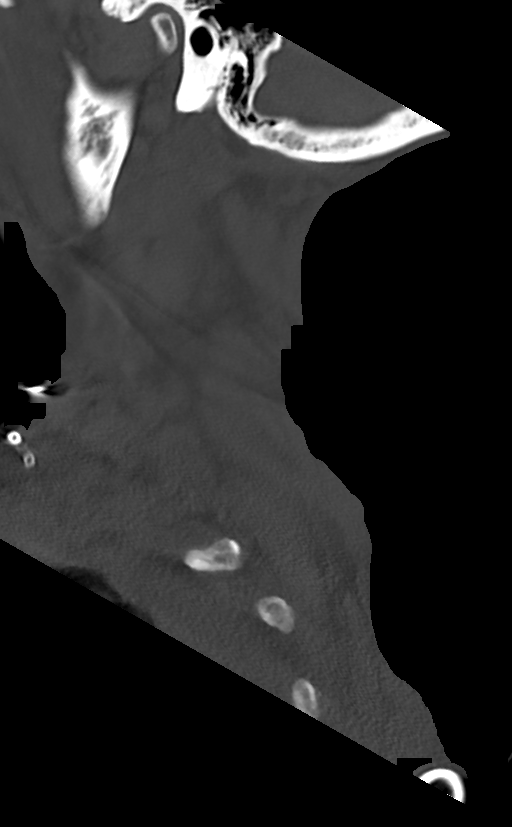
[im 21/52  bone]
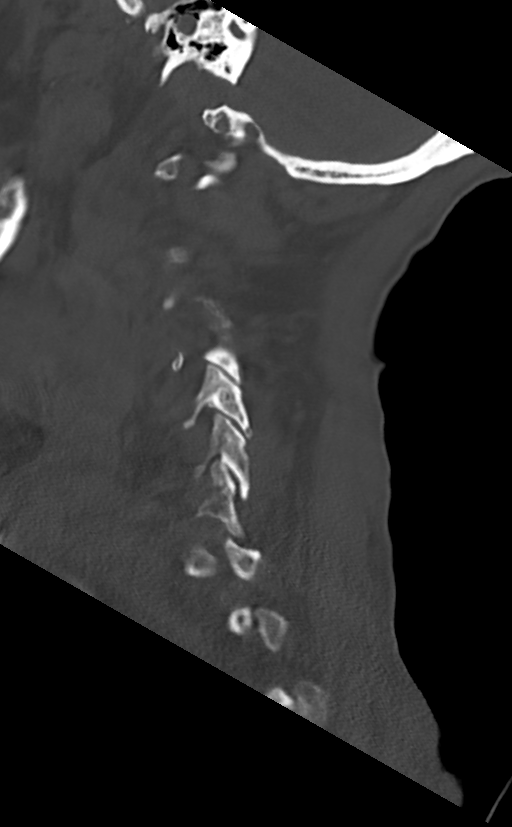
[im 31/52  bone]
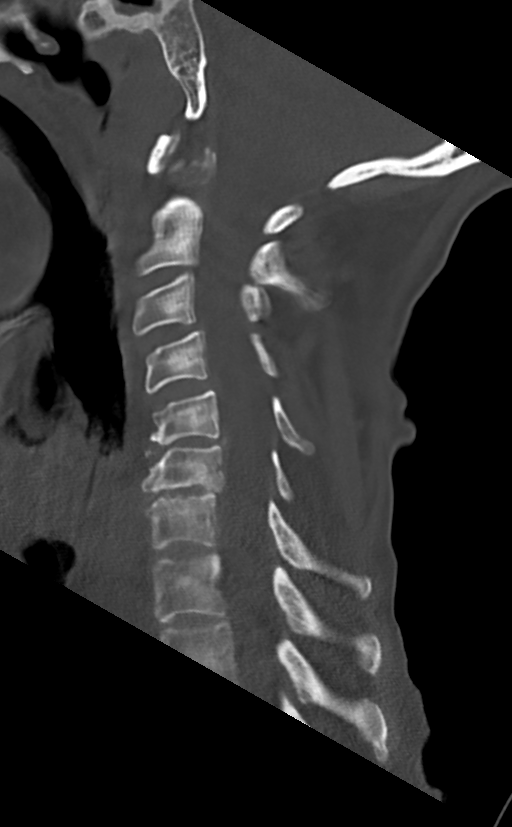
[im 41/52  bone]
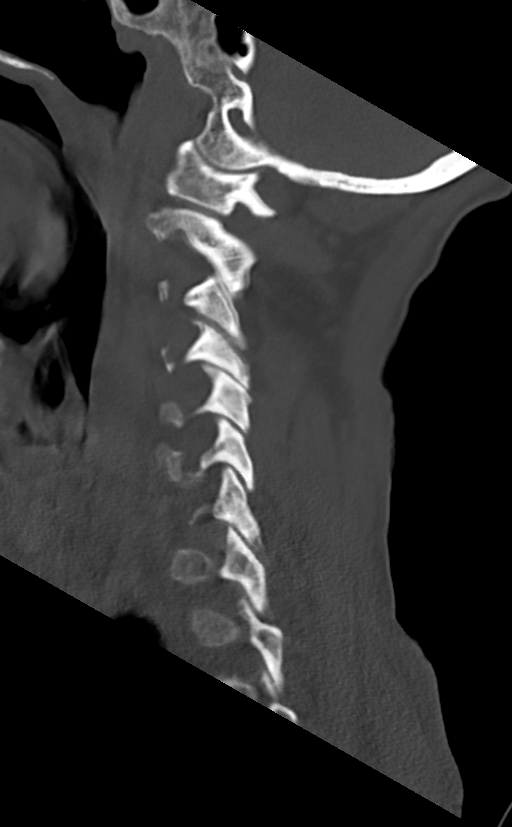

[Series 14: orthogonal axials · axial · 0.21mm/px · z∈[-242,-210]mm · 2 of 78 slices shown, 3 images]
[im 26/78  soft-tissue]
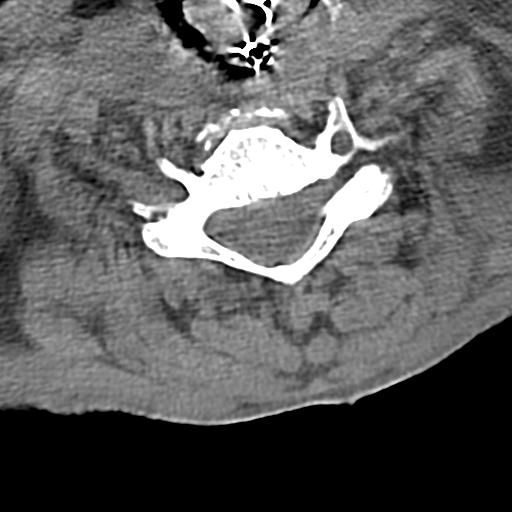
[im 26/78  bone]
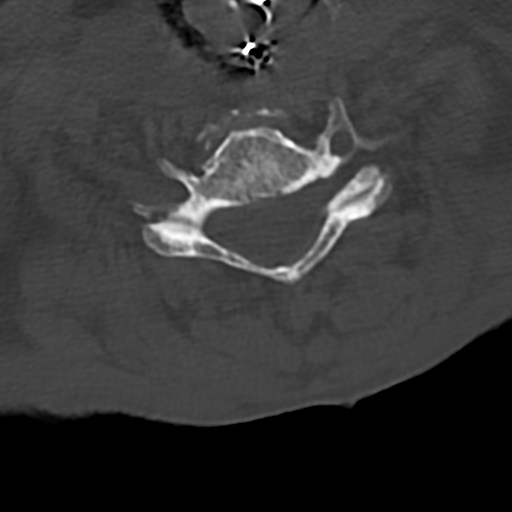
[im 52/78  bone]
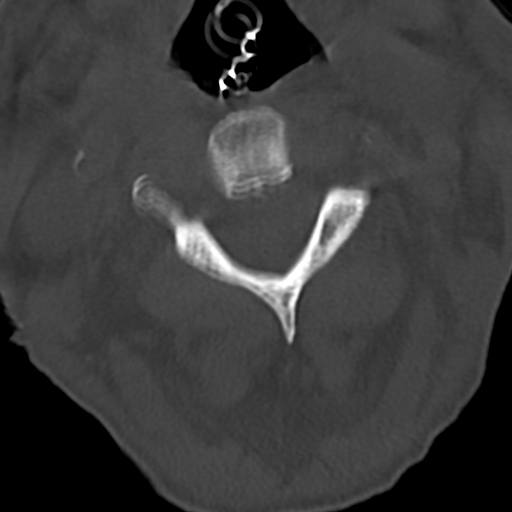

[13 of 33 positions shown; findings below may reference images not displayed]

FINDINGS: CT HEAD FINDINGS

Brain: Large extra-axial hemorrhage along the right cerebral
convexity, subdural in appearance. The hemorrhage is mixed density
from blood clot and un clotted components. There is marked mass
effect on the brain with 18 mm of left shift. The subdural
hemorrhage tracks along the inferior right cerebrum where there
appears to be a different layer of subdural dissection. There may
also be inferior frontal and temporal contusion. Small volume
bleeding along the left occipital lobe which could be subarachnoid
or parenchymal. No evidence of complicating infarct. No entrapment
changes currently.

Vascular: No hyperdense vessel or unexpected calcification.

Skull: Large hematoma with gas along the right-sided scalp. No
calvarial fracture.

Sinuses/Orbits: Bilateral cataract resection

Other: Critical Value/emergent results were called by telephone at
the time of interpretation on 11/09/2018 at [DATE] to
Marcelo Lay , who was already aware of the findings.

CT CERVICAL SPINE FINDINGS

Alignment: Reversal of cervical lordosis with mild C3-4 and C4-5
anterolisthesis which appears positional or degenerative.

Skull base and vertebrae: No acute fracture

Soft tissues and spinal canal: No prevertebral fluid or swelling. No
visible canal hematoma.

Disc levels: Lower cervical disc narrowing and bulging with endplate
degeneration. Upper cervical predominant facet arthropathy
especially at C3-4.

Upper chest: Partially covered right pleural effusion. There is
pending chest x-ray
IMPRESSION: 1. Large acute subdural hematoma (3.5 cm in thickness) on the right
with severe intracranial mass effect causing 18 mm of midline shift.
2. Small volume subarachnoid hemorrhage or contusion in the left
occipital lobe.
3. Possible contusion in the inferior right frontal and temporal
lobes.
4. No calvarial fracture under the very large scalp hematoma.
5. Negative for cervical spine fracture.
